# Patient Record
Sex: Female | Born: 1975 | Race: White | Hispanic: No | Marital: Married | Smoking: Never smoker
Health system: Southern US, Community
[De-identification: ages and names within clinical notes are randomized; demographics above are authoritative.]

## PROBLEM LIST (undated history)

## (undated) DIAGNOSIS — J982 Interstitial emphysema: Secondary | ICD-10-CM

## (undated) DIAGNOSIS — J1282 Pneumonia due to coronavirus disease 2019: Secondary | ICD-10-CM

## (undated) DIAGNOSIS — J9621 Acute and chronic respiratory failure with hypoxia: Secondary | ICD-10-CM

## (undated) DIAGNOSIS — U071 COVID-19: Secondary | ICD-10-CM

---

## 2013-03-23 DIAGNOSIS — K8010 Calculus of gallbladder with chronic cholecystitis without obstruction: Secondary | ICD-10-CM | POA: Insufficient documentation

## 2014-12-03 DIAGNOSIS — F410 Panic disorder [episodic paroxysmal anxiety] without agoraphobia: Secondary | ICD-10-CM | POA: Insufficient documentation

## 2014-12-03 DIAGNOSIS — K5904 Chronic idiopathic constipation: Secondary | ICD-10-CM | POA: Insufficient documentation

## 2014-12-03 DIAGNOSIS — F411 Generalized anxiety disorder: Secondary | ICD-10-CM | POA: Insufficient documentation

## 2015-04-02 DIAGNOSIS — F419 Anxiety disorder, unspecified: Secondary | ICD-10-CM | POA: Insufficient documentation

## 2016-05-04 DIAGNOSIS — M7661 Achilles tendinitis, right leg: Secondary | ICD-10-CM | POA: Insufficient documentation

## 2016-05-04 DIAGNOSIS — M7730 Calcaneal spur, unspecified foot: Secondary | ICD-10-CM | POA: Insufficient documentation

## 2016-12-09 DIAGNOSIS — G43009 Migraine without aura, not intractable, without status migrainosus: Secondary | ICD-10-CM | POA: Insufficient documentation

## 2019-12-14 ENCOUNTER — Inpatient Hospital Stay
Admit: 2019-12-14 | Discharge: 2020-01-04 | Disposition: A | Discharge status: Skilled Nursing Facility | Payer: BC Managed Care – PPO | Source: Other Acute Inpatient Hospital | Attending: Internal Medicine | Admitting: Internal Medicine

## 2019-12-14 DIAGNOSIS — J9621 Acute and chronic respiratory failure with hypoxia: Secondary | ICD-10-CM | POA: Diagnosis present

## 2019-12-14 DIAGNOSIS — J1282 Pneumonia due to coronavirus disease 2019: Secondary | ICD-10-CM | POA: Diagnosis present

## 2019-12-14 DIAGNOSIS — J189 Pneumonia, unspecified organism: Secondary | ICD-10-CM

## 2019-12-14 DIAGNOSIS — U071 COVID-19: Secondary | ICD-10-CM | POA: Diagnosis present

## 2019-12-14 DIAGNOSIS — J982 Interstitial emphysema: Secondary | ICD-10-CM | POA: Diagnosis present

## 2019-12-14 DIAGNOSIS — J9690 Respiratory failure, unspecified, unspecified whether with hypoxia or hypercapnia: Secondary | ICD-10-CM

## 2019-12-14 DIAGNOSIS — Z931 Gastrostomy status: Secondary | ICD-10-CM

## 2019-12-14 HISTORY — DX: COVID-19: U07.1

## 2019-12-14 HISTORY — DX: Interstitial emphysema: J98.2

## 2019-12-14 HISTORY — DX: Acute and chronic respiratory failure with hypoxia: J96.21

## 2019-12-14 HISTORY — DX: Pneumonia due to coronavirus disease 2019: J12.82

## 2019-12-15 ENCOUNTER — Other Ambulatory Visit (HOSPITAL_COMMUNITY): Payer: BC Managed Care – PPO

## 2019-12-15 DIAGNOSIS — U071 COVID-19: Secondary | ICD-10-CM | POA: Diagnosis not present

## 2019-12-15 DIAGNOSIS — J9621 Acute and chronic respiratory failure with hypoxia: Secondary | ICD-10-CM | POA: Diagnosis not present

## 2019-12-15 DIAGNOSIS — J982 Interstitial emphysema: Secondary | ICD-10-CM | POA: Diagnosis not present

## 2019-12-15 DIAGNOSIS — J1282 Pneumonia due to coronavirus disease 2019: Secondary | ICD-10-CM

## 2019-12-15 LAB — URINALYSIS, ROUTINE W REFLEX MICROSCOPIC
Bilirubin Urine: NEGATIVE
Glucose, UA: NEGATIVE mg/dL
Hgb urine dipstick: NEGATIVE
Ketones, ur: NEGATIVE mg/dL
Leukocytes,Ua: NEGATIVE
Nitrite: NEGATIVE
Protein, ur: NEGATIVE mg/dL
Specific Gravity, Urine: 1.018 (ref 1.005–1.030)
pH: 6 (ref 5.0–8.0)

## 2019-12-15 LAB — CBC
HCT: 37.6 % (ref 36.0–46.0)
Hemoglobin: 11.4 g/dL — ABNORMAL LOW (ref 12.0–15.0)
MCH: 25.3 pg — ABNORMAL LOW (ref 26.0–34.0)
MCHC: 30.3 g/dL (ref 30.0–36.0)
MCV: 83.4 fL (ref 80.0–100.0)
Platelets: 362 10*3/uL (ref 150–400)
RBC: 4.51 MIL/uL (ref 3.87–5.11)
RDW: 24.1 % — ABNORMAL HIGH (ref 11.5–15.5)
WBC: 13.1 10*3/uL — ABNORMAL HIGH (ref 4.0–10.5)
nRBC: 0.2 % (ref 0.0–0.2)

## 2019-12-15 LAB — COMPREHENSIVE METABOLIC PANEL
ALT: 77 U/L — ABNORMAL HIGH (ref 0–44)
AST: 38 U/L (ref 15–41)
Albumin: 2.9 g/dL — ABNORMAL LOW (ref 3.5–5.0)
Alkaline Phosphatase: 116 U/L (ref 38–126)
Anion gap: 13 (ref 5–15)
BUN: 16 mg/dL (ref 6–20)
CO2: 24 mmol/L (ref 22–32)
Calcium: 10 mg/dL (ref 8.9–10.3)
Chloride: 101 mmol/L (ref 98–111)
Creatinine, Ser: 0.46 mg/dL (ref 0.44–1.00)
GFR, Estimated: >60 mL/min (ref >60)
Glucose, Bld: 136 mg/dL — ABNORMAL HIGH (ref 70–99)
Potassium: 3.9 mmol/L (ref 3.5–5.1)
Sodium: 138 mmol/L (ref 135–145)
Total Bilirubin: 0.5 mg/dL (ref 0.3–1.2)
Total Protein: 6.6 g/dL (ref 6.5–8.1)

## 2019-12-15 LAB — BLOOD GAS, ARTERIAL
Acid-Base Excess: 3.6 mmol/L — ABNORMAL HIGH (ref 0.0–2.0)
Bicarbonate: 28.1 mmol/L — ABNORMAL HIGH (ref 20.0–28.0)
FIO2: 45
O2 Saturation: 98.6 %
Patient temperature: 36.8
pCO2 arterial: 45.7 mmHg (ref 32.0–48.0)
pH, Arterial: 7.404 (ref 7.350–7.450)
pO2, Arterial: 126 mmHg — ABNORMAL HIGH (ref 83.0–108.0)

## 2019-12-15 LAB — MAGNESIUM: Magnesium: 1.9 mg/dL (ref 1.7–2.4)

## 2019-12-15 MED ORDER — IOHEXOL 300 MG/ML  SOLN
40.0000 mL | Freq: Once | INTRAMUSCULAR | Status: AC | PRN
  Administered 2019-12-15: 40 mL

## 2019-12-15 NOTE — Consult Note (Signed)
Pulmonary Critical Care Medicine [facility] [place]  PULMONARY SERVICE  Date of Service: 12/15/2019  PULMONARY CRITICAL CARE CONSULT   Susan [name]  ZOX:[id number]  DOB: Feb 08, 1976   DOA: 12/14/2019  Referring Physician: Carron Curie, MD  HPI: Susan [name] is a 44 y.o. female seen for follow up of Acute on Chronic Respiratory Failure.  Patient came into the hospital because of increasing shortness of breath.  He was severely hypoxic at the time that the patient was evaluated and subsequently was diagnosed with COVID-19 pneumonia.  Patient was admitted on September 8 and remained in the hospital for prolonged period of time because of failure to come off of the ventilator.  Hospital course was quite complicated apparently developed a pneumomediastinum.  The patient subsequently not able to come off the ventilator and is transferred to our facility for further management and weaning.  Other medical issues include history of anxiety in the past which may also be a limiting factor as far as being able.  Review of Systems:  ROS performed and is unremarkable other than noted above.  Past Medical History:  Diagnosis Date  . Allergy  . Anxiety  . Heart murmur   Past Surgical History:  Procedure Laterality Date  . CHOLECYSTECTOMY  . HYSTERECTOMY  . TONSILLECTOMY  . TUBAL LIGATION   Family History  Problem Relation Age of Onset  . Hypertension Mother  . Cancer Father  . Asthma Son  . Arthritis Maternal Grandmother  . Hyperlipidemia Maternal Grandmother    Medications: Reviewed on Rounds  Physical Exam:  Vitals: Temperature is 97.3 pulse 110 respiratory rate 22 blood pressure is 133/87 saturations 99%  Ventilator Settings on assist control FiO2 is 45% tidal volume 450 PEEP 5  . General: Comfortable at this time . Eyes: Grossly normal lids, irises & conjunctiva . ENT: grossly tongue is normal . Neck: no obvious mass . Cardiovascular: S1-S2 normal no gallop  or rub . Respiratory: Scattered rhonchi expansion is equal . Abdomen: Soft nontender . Skin: no rash seen on limited exam . Musculoskeletal: not rigid . Psychiatric:unable to assess . Neurologic: no seizure no involuntary movements         Labs on Admission:  Basic Metabolic Panel: Recent Labs  Lab 12/15/19 0447  NA 138  K 3.9  CL 101  CO2 24  GLUCOSE 136*  BUN 16  CREATININE 0.46  CALCIUM 10.0  MG 1.9    No results for input(s): PHART, PCO2ART, PO2ART, HCO3, O2SAT in the last 168 hours.  Liver Function Tests: Recent Labs  Lab 12/15/19 0447  AST 38  ALT 77*  ALKPHOS 116  BILITOT 0.5  PROT 6.6  ALBUMIN 2.9*   No results for input(s): LIPASE, AMYLASE in the last 168 hours. No results for input(s): AMMONIA in the last 168 hours.  CBC: Recent Labs  Lab 12/15/19 0447  WBC 13.1*  HGB 11.4*  HCT 37.6  MCV 83.4  PLT 362    Cardiac Enzymes: No results for input(s): CKTOTAL, CKMB, CKMBINDEX, TROPONINI in the last 168 hours.  BNP (last 3 results) No results for input(s): BNP in the last 8760 hours.  ProBNP (last 3 results) No results for input(s): PROBNP in the last 8760 hours.   Radiological Exams on Admission: DG ABDOMEN PEG TUBE LOCATION  Result Date: 12/15/2019 CLINICAL DATA:  Peg tube placement verification. EXAM: ABDOMEN - 1 VIEW COMPARISON:  None. FINDINGS: PO contrast noted to partially opacify the gastric lumen and duodenal lumen. PO contrast  reaches to the fourth portion of the duodenum/region of the ligamentum teres. The bowel gas pattern is normal. Surgical clips within the right upper quadrant. Suggestion of a gastrostomy tube overlying the gastric lumen. No radio-opaque calculi or other significant radiographic abnormality are seen. IMPRESSION: 1. PO contrast partially opacifies the gastric lumen and duodenal lumen suggesting proper positioning of the PEG tube. 2. Nonobstructive bowel gas pattern. Electronically Signed   By: Tish Frederickson M.D.    On: 12/15/2019 01:18   DG CHEST PORT 1 VIEW  Result Date: 12/15/2019 CLINICAL DATA:  Peg tube placement verification, respiratory failure. EXAM: PORTABLE CHEST 1 VIEW COMPARISON:  Chest x-ray 12/13/2019 FINDINGS: Tracheostomy tube with tip terminating approximately 4 cm above the carina. The heart size and mediastinal contours are unchanged. Low lung volumes with bibasilar compressive atelectasis. Diffuse patchy airspace opacities. No pulmonary edema. No pleural effusion. No pneumothorax. No acute osseous abnormality. Partially visualized gastric lumen opacified with PO contrast. IMPRESSION: 1. Low lung volumes with multifocal pneumonia. 2. Please see separately dictated x-ray abdomen 12/15/2019. Electronically Signed   By: Tish Frederickson M.D.   On: 12/15/2019 01:20    Assessment/Plan Active Problems:   Acute on chronic respiratory failure with hypoxia (HCC)   COVID-19 virus infection   Pneumonia due to COVID-19 virus   Pneumomediastinum (HCC)   1. Acute on chronic respiratory failure with hypoxia respiratory therapy will check the RSB I mechanics try to wean protocol started today 2. COVID-19 virus infection in recovery phase continue supportive care. 3. COVID-19 pneumonia has been treated patient does not receive antibiotics.  We will continue to follow radiologically.  Currently chest x-ray still showing some significant infiltrates with low lung volumes. 4. Pneumomediastinum noted at the other facility.  We will continue to monitor no other active intervention necessary right now.  I have personally seen and evaluated the patient, evaluated laboratory and imaging results, formulated the assessment and plan and placed orders. The Patient requires high complexity decision making with multiple systems involvement.  Case was discussed on Rounds with the Respiratory Therapy Director and the Respiratory staff Time Spent  Yevonne Pax, MD [facility] Pulmonary Critical Care  Medicine Sleep Medicine

## 2019-12-16 ENCOUNTER — Other Ambulatory Visit (HOSPITAL_COMMUNITY): Payer: BC Managed Care – PPO

## 2019-12-16 DIAGNOSIS — J1282 Pneumonia due to coronavirus disease 2019: Secondary | ICD-10-CM | POA: Diagnosis not present

## 2019-12-16 DIAGNOSIS — U071 COVID-19: Secondary | ICD-10-CM | POA: Diagnosis not present

## 2019-12-16 DIAGNOSIS — J9621 Acute and chronic respiratory failure with hypoxia: Secondary | ICD-10-CM | POA: Diagnosis not present

## 2019-12-16 DIAGNOSIS — J982 Interstitial emphysema: Secondary | ICD-10-CM | POA: Diagnosis not present

## 2019-12-16 LAB — BASIC METABOLIC PANEL
Anion gap: 11 (ref 5–15)
BUN: 19 mg/dL (ref 6–20)
CO2: 29 mmol/L (ref 22–32)
Calcium: 9.7 mg/dL (ref 8.9–10.3)
Chloride: 98 mmol/L (ref 98–111)
Creatinine, Ser: 0.45 mg/dL (ref 0.44–1.00)
GFR, Estimated: >60 mL/min (ref >60)
Glucose, Bld: 111 mg/dL — ABNORMAL HIGH (ref 70–99)
Potassium: 4.1 mmol/L (ref 3.5–5.1)
Sodium: 138 mmol/L (ref 135–145)

## 2019-12-16 LAB — T4, FREE: Free T4: 1.07 ng/dL (ref 0.61–1.12)

## 2019-12-16 LAB — CBC
HCT: 36.8 % (ref 36.0–46.0)
Hemoglobin: 11.1 g/dL — ABNORMAL LOW (ref 12.0–15.0)
MCH: 24.7 pg — ABNORMAL LOW (ref 26.0–34.0)
MCHC: 30.2 g/dL (ref 30.0–36.0)
MCV: 82 fL (ref 80.0–100.0)
Platelets: 356 10*3/uL (ref 150–400)
RBC: 4.49 MIL/uL (ref 3.87–5.11)
RDW: 23.4 % — ABNORMAL HIGH (ref 11.5–15.5)
WBC: 16.7 10*3/uL — ABNORMAL HIGH (ref 4.0–10.5)
nRBC: 0 % (ref 0.0–0.2)

## 2019-12-16 LAB — PHOSPHORUS: Phosphorus: 4 mg/dL (ref 2.5–4.6)

## 2019-12-16 LAB — URINE CULTURE: Culture: NO GROWTH

## 2019-12-16 LAB — MAGNESIUM: Magnesium: 2.1 mg/dL (ref 1.7–2.4)

## 2019-12-16 LAB — TSH: TSH: 1.583 u[IU]/mL (ref 0.350–4.500)

## 2019-12-16 MED ORDER — IOHEXOL 350 MG/ML SOLN
50.0000 mL | Freq: Once | INTRAVENOUS | Status: AC | PRN
  Administered 2019-12-16: 50 mL

## 2019-12-16 NOTE — Progress Notes (Addendum)
Pulmonary Critical Care Medicine [facility] [place]   PULMONARY CRITICAL CARE SERVICE  PROGRESS NOTE  Date of Service: 12/16/2019  Susan [name]  YWV:[id number]  DOB: 1975/07/21   DOA: 12/14/2019  Referring Physician: Carron Curie, MD  HPI: Susan [name] is a 44 y.o. female seen for follow up of Acute on Chronic Respiratory Failure.  Patient is unable to wean today due to tachycardia remains on full support currently rate of 10 with FiO2 45%.  Medications: Reviewed on Rounds  Physical Exam:  Vitals: Pulse 127 respirations 25 BP 141/78 O2 sat 99% temp 98.7  Ventilator Settings ventilator mode AC VC rate of 10 tidal volume 450 PEEP of 5 and FiO2 of 45%  . General: Comfortable at this time . Eyes: Grossly normal lids, irises & conjunctiva . ENT: grossly tongue is normal . Neck: no obvious mass . Cardiovascular: S1 S2 normal no gallop . Respiratory: Coarse breath sounds . Abdomen: soft . Skin: no rash seen on limited exam . Musculoskeletal: not rigid . Psychiatric:unable to assess . Neurologic: no seizure no involuntary movements         Lab Data:   Basic Metabolic Panel: Recent Labs  Lab 12/15/19 0447 12/16/19 0411  NA 138 138  K 3.9 4.1  CL 101 98  CO2 24 29  GLUCOSE 136* 111*  BUN 16 19  CREATININE 0.46 0.45  CALCIUM 10.0 9.7  MG 1.9 2.1  PHOS  --  4.0    ABG: Recent Labs  Lab 12/15/19 1220  PHART 7.404  PCO2ART 45.7  PO2ART 126*  HCO3 28.1*  O2SAT 98.6    Liver Function Tests: Recent Labs  Lab 12/15/19 0447  AST 38  ALT 77*  ALKPHOS 116  BILITOT 0.5  PROT 6.6  ALBUMIN 2.9*   No results for input(s): LIPASE, AMYLASE in the last 168 hours. No results for input(s): AMMONIA in the last 168 hours.  CBC: Recent Labs  Lab 12/15/19 0447 12/16/19 0411  WBC 13.1* 16.7*  HGB 11.4* 11.1*  HCT 37.6 36.8  MCV 83.4 82.0  PLT 362 356    Cardiac Enzymes: No results for input(s): CKTOTAL, CKMB, CKMBINDEX, TROPONINI in the  last 168 hours.  BNP (last 3 results) No results for input(s): BNP in the last 8760 hours.  ProBNP (last 3 results) No results for input(s): PROBNP in the last 8760 hours.  Radiological Exams: DG ABDOMEN PEG TUBE LOCATION  Result Date: 12/16/2019 CLINICAL DATA:  Peg tube placement evaluation. EXAM: ABDOMEN - 1 VIEW COMPARISON:  December 15, 2019 FINDINGS: The study was performed after injecting the PEG tube with 50 mL of Omnipaque. There is contrast in the stomach and proximal duodenum. No evidence of leak. There is a small amount of contrast in the colon, likely from yesterday's study. No other acute abnormalities. IMPRESSION: The PEG tube appears to be in good position as above. No evidence of leak on this study. Electronically Signed   By: Gerome Sam III M.D   On: 12/16/2019 12:57   DG ABDOMEN PEG TUBE LOCATION  Result Date: 12/15/2019 CLINICAL DATA:  Peg tube placement verification. EXAM: ABDOMEN - 1 VIEW COMPARISON:  None. FINDINGS: PO contrast noted to partially opacify the gastric lumen and duodenal lumen. PO contrast reaches to the fourth portion of the duodenum/region of the ligamentum teres. The bowel gas pattern is normal. Surgical clips within the right upper quadrant. Suggestion of a gastrostomy tube overlying the gastric lumen. No radio-opaque calculi or other significant radiographic  abnormality are seen. IMPRESSION: 1. PO contrast partially opacifies the gastric lumen and duodenal lumen suggesting proper positioning of the PEG tube. 2. Nonobstructive bowel gas pattern. Electronically Signed   By: Tish Frederickson M.D.   On: 12/15/2019 01:18   DG CHEST PORT 1 VIEW  Result Date: 12/15/2019 CLINICAL DATA:  Peg tube placement verification, respiratory failure. EXAM: PORTABLE CHEST 1 VIEW COMPARISON:  Chest x-ray 12/13/2019 FINDINGS: Tracheostomy tube with tip terminating approximately 4 cm above the carina. The heart size and mediastinal contours are unchanged. Low lung volumes  with bibasilar compressive atelectasis. Diffuse patchy airspace opacities. No pulmonary edema. No pleural effusion. No pneumothorax. No acute osseous abnormality. Partially visualized gastric lumen opacified with PO contrast. IMPRESSION: 1. Low lung volumes with multifocal pneumonia. 2. Please see separately dictated x-ray abdomen 12/15/2019. Electronically Signed   By: Tish Frederickson M.D.   On: 12/15/2019 01:20    Assessment/Plan Active Problems:   * No active hospital problems. *   1. Acute on chronic respiratory failure with hypoxia patient is unable to wean today will be continue to monitor for R SBI mechanics.  Continue supportive measures and pulmonary toilet. 2. COVID-19 virus infection in recovery phase continue supportive care. 3. COVID-19 pneumonia has been treated patient does not receive antibiotics.  We will continue to follow radiologically.  Currently chest x-ray still showing some significant infiltrates with low lung volumes. 4. Pneumomediastinum noted at the other facility.  We will continue to monitor no other active intervention necessary right now.   I have personally seen and evaluated the patient, evaluated laboratory and imaging results, formulated the assessment and plan and placed orders. The Patient requires high complexity decision making with multiple systems involvement.  Rounds were done with the Respiratory Therapy Director and Staff therapists and discussed with nursing staff also.  Yevonne Pax, MD [facility] Pulmonary Critical Care Medicine Sleep Medicine

## 2019-12-17 DIAGNOSIS — J1282 Pneumonia due to coronavirus disease 2019: Secondary | ICD-10-CM | POA: Diagnosis not present

## 2019-12-17 DIAGNOSIS — U071 COVID-19: Secondary | ICD-10-CM | POA: Diagnosis not present

## 2019-12-17 DIAGNOSIS — J9621 Acute and chronic respiratory failure with hypoxia: Secondary | ICD-10-CM | POA: Diagnosis not present

## 2019-12-17 DIAGNOSIS — J982 Interstitial emphysema: Secondary | ICD-10-CM | POA: Diagnosis not present

## 2019-12-17 LAB — BASIC METABOLIC PANEL
Anion gap: 11 (ref 5–15)
BUN: 20 mg/dL (ref 6–20)
CO2: 28 mmol/L (ref 22–32)
Calcium: 9.7 mg/dL (ref 8.9–10.3)
Chloride: 98 mmol/L (ref 98–111)
Creatinine, Ser: 0.59 mg/dL (ref 0.44–1.00)
GFR, Estimated: >60 mL/min (ref >60)
Glucose, Bld: 144 mg/dL — ABNORMAL HIGH (ref 70–99)
Potassium: 4.1 mmol/L (ref 3.5–5.1)
Sodium: 137 mmol/L (ref 135–145)

## 2019-12-17 LAB — CBC
HCT: 35 % — ABNORMAL LOW (ref 36.0–46.0)
Hemoglobin: 10.7 g/dL — ABNORMAL LOW (ref 12.0–15.0)
MCH: 25.3 pg — ABNORMAL LOW (ref 26.0–34.0)
MCHC: 30.6 g/dL (ref 30.0–36.0)
MCV: 82.7 fL (ref 80.0–100.0)
Platelets: 316 10*3/uL (ref 150–400)
RBC: 4.23 MIL/uL (ref 3.87–5.11)
RDW: 23.6 % — ABNORMAL HIGH (ref 11.5–15.5)
WBC: 13.1 10*3/uL — ABNORMAL HIGH (ref 4.0–10.5)
nRBC: 0 % (ref 0.0–0.2)

## 2019-12-17 LAB — CULTURE, RESPIRATORY W GRAM STAIN

## 2019-12-17 LAB — MAGNESIUM: Magnesium: 2 mg/dL (ref 1.7–2.4)

## 2019-12-17 LAB — T3: T3, Total: 110 ng/dL (ref 71–180)

## 2019-12-17 LAB — HEMOGLOBIN A1C
Hgb A1c MFr Bld: 5.3 % (ref 4.8–5.6)
Mean Plasma Glucose: 105 mg/dL

## 2019-12-17 LAB — PHOSPHORUS: Phosphorus: 4.7 mg/dL — ABNORMAL HIGH (ref 2.5–4.6)

## 2019-12-17 NOTE — Progress Notes (Signed)
Pulmonary Critical Care Medicine [facility] [place]   PULMONARY CRITICAL CARE SERVICE  PROGRESS NOTE  Date of Service: 12/17/2019  Susan [name]  YFV:[id number]  DOB: 04-17-1975   DOA: 12/14/2019  Referring Physician: Carron Curie, MD  HPI: Susan [name] is a 43 y.o. female seen for follow up of Acute on Chronic Respiratory Failure.  Patient currently is on full support on the ventilator.  Should be started on the wean protocol today and pressure support  Medications: Reviewed on Rounds  Physical Exam:  Vitals: Temperature is 97.4 pulse 130 respiratory 31 blood pressure is 122/82 saturations 100%  Ventilator Settings on assist control FiO2 is 45% tidal volume 450 PEEP 5  . General: Comfortable at this time . Eyes: Grossly normal lids, irises & conjunctiva . ENT: grossly tongue is normal . Neck: no obvious mass . Cardiovascular: S1 S2 normal no gallop . Respiratory: Coarse breath sounds with few scattered rhonchi . Abdomen: soft . Skin: no rash seen on limited exam . Musculoskeletal: not rigid . Psychiatric:unable to assess . Neurologic: no seizure no involuntary movements         Lab Data:   Basic Metabolic Panel: Recent Labs  Lab 12/15/19 0447 12/16/19 0411 12/17/19 0722  NA 138 138 137  K 3.9 4.1 4.1  CL 101 98 98  CO2 24 29 28   GLUCOSE 136* 111* 144*  BUN 16 19 20   CREATININE 0.46 0.45 0.59  CALCIUM 10.0 9.7 9.7  MG 1.9 2.1 2.0  PHOS  --  4.0 4.7*    ABG: Recent Labs  Lab 12/15/19 1220  PHART 7.404  PCO2ART 45.7  PO2ART 126*  HCO3 28.1*  O2SAT 98.6    Liver Function Tests: Recent Labs  Lab 12/15/19 0447  AST 38  ALT 77*  ALKPHOS 116  BILITOT 0.5  PROT 6.6  ALBUMIN 2.9*   No results for input(s): LIPASE, AMYLASE in the last 168 hours. No results for input(s): AMMONIA in the last 168 hours.  CBC: Recent Labs  Lab 12/15/19 0447 12/16/19 0411 12/17/19 0722  WBC 13.1* 16.7* 13.1*  HGB 11.4* 11.1* 10.7*  HCT 37.6  36.8 35.0*  MCV 83.4 82.0 82.7  PLT 362 356 316    Cardiac Enzymes: No results for input(s): CKTOTAL, CKMB, CKMBINDEX, TROPONINI in the last 168 hours.  BNP (last 3 results) No results for input(s): BNP in the last 8760 hours.  ProBNP (last 3 results) No results for input(s): PROBNP in the last 8760 hours.  Radiological Exams: DG ABDOMEN PEG TUBE LOCATION  Result Date: 12/16/2019 CLINICAL DATA:  Peg tube placement evaluation. EXAM: ABDOMEN - 1 VIEW COMPARISON:  December 15, 2019 FINDINGS: The study was performed after injecting the PEG tube with 50 mL of Omnipaque. There is contrast in the stomach and proximal duodenum. No evidence of leak. There is a small amount of contrast in the colon, likely from yesterday's study. No other acute abnormalities. IMPRESSION: The PEG tube appears to be in good position as above. No evidence of leak on this study. Electronically Signed   By: 12/18/2019 III M.D   On: 12/16/2019 12:57    Assessment/Plan Active Problems:   Acute on chronic respiratory failure with hypoxia (HCC)   COVID-19 virus infection   Pneumonia due to COVID-19 virus   Pneumomediastinum (HCC)   1. Acute on chronic respiratory failure hypoxia we'll try to begin with the weaning protocol on pressure support mode today.  Titrate oxygen down as tolerated also 2.  COVID-19 pneumonia in resolution phase we will continue with supportive care. 3. COVID-19 virus infection resolved the patient does have residual damage noted on chest films. 4. Pneumomediastinum  supportive care no active intervention at this time.   I have personally seen and evaluated the patient, evaluated laboratory and imaging results, formulated the assessment and plan and placed orders. The Patient requires high complexity decision making with multiple systems involvement.  Rounds were done with the Respiratory Therapy Director and Staff therapists and discussed with nursing staff also.  Yevonne Pax, MD  [facility] Pulmonary Critical Care Medicine Sleep Medicine

## 2019-12-18 ENCOUNTER — Other Ambulatory Visit (HOSPITAL_COMMUNITY): Payer: BC Managed Care – PPO

## 2019-12-18 ENCOUNTER — Encounter: Payer: Self-pay | Admitting: Internal Medicine

## 2019-12-18 DIAGNOSIS — U071 COVID-19: Secondary | ICD-10-CM | POA: Diagnosis not present

## 2019-12-18 DIAGNOSIS — J9621 Acute and chronic respiratory failure with hypoxia: Secondary | ICD-10-CM | POA: Diagnosis not present

## 2019-12-18 DIAGNOSIS — J982 Interstitial emphysema: Secondary | ICD-10-CM | POA: Diagnosis not present

## 2019-12-18 DIAGNOSIS — J1282 Pneumonia due to coronavirus disease 2019: Secondary | ICD-10-CM | POA: Diagnosis not present

## 2019-12-18 LAB — PROTIME-INR
INR: 1.1 (ref 0.8–1.2)
Prothrombin Time: 13.4 seconds (ref 11.4–15.2)

## 2019-12-18 LAB — RENAL FUNCTION PANEL
Albumin: 2.8 g/dL — ABNORMAL LOW (ref 3.5–5.0)
Anion gap: 10 (ref 5–15)
BUN: 16 mg/dL (ref 6–20)
CO2: 31 mmol/L (ref 22–32)
Calcium: 9.7 mg/dL (ref 8.9–10.3)
Chloride: 98 mmol/L (ref 98–111)
Creatinine, Ser: 0.55 mg/dL (ref 0.44–1.00)
GFR, Estimated: >60 mL/min (ref >60)
Glucose, Bld: 130 mg/dL — ABNORMAL HIGH (ref 70–99)
Phosphorus: 5.5 mg/dL — ABNORMAL HIGH (ref 2.5–4.6)
Potassium: 3.7 mmol/L (ref 3.5–5.1)
Sodium: 139 mmol/L (ref 135–145)

## 2019-12-18 LAB — CBC
HCT: 36.4 % (ref 36.0–46.0)
Hemoglobin: 11 g/dL — ABNORMAL LOW (ref 12.0–15.0)
MCH: 25.1 pg — ABNORMAL LOW (ref 26.0–34.0)
MCHC: 30.2 g/dL (ref 30.0–36.0)
MCV: 83.1 fL (ref 80.0–100.0)
Platelets: 316 10*3/uL (ref 150–400)
RBC: 4.38 MIL/uL (ref 3.87–5.11)
RDW: 23.3 % — ABNORMAL HIGH (ref 11.5–15.5)
WBC: 11.2 10*3/uL — ABNORMAL HIGH (ref 4.0–10.5)
nRBC: 0 % (ref 0.0–0.2)

## 2019-12-18 LAB — APTT: aPTT: 40 seconds — ABNORMAL HIGH (ref 24–36)

## 2019-12-18 MED ORDER — IOPAMIDOL (ISOVUE-300) INJECTION 61%: 20.0000 mL | Freq: Once | INTRAVENOUS | Status: DC | PRN

## 2019-12-18 NOTE — Progress Notes (Addendum)
Pulmonary Critical Care Medicine [facility] [place]   PULMONARY CRITICAL CARE SERVICE  PROGRESS NOTE  Date of Service: 12/18/2019  Susan [name]  [id number]  DOB: 1975-03-12   DOA: 12/14/2019  Referring Physician: Carron Curie, MD  HPI: Susan [name] is a 44 y.o. female seen for follow up of Acute on Chronic Respiratory Failure.  Patient continues on full support on the vent assist-control mode rate 24 with an FiO2 of 45% currently satting well this time no distress.  Medications: Reviewed on Rounds  Physical Exam:  Vitals: Pulse 120 respirations 35 BP 132/86 O2 sat 99% temp 97.4  Ventilator Settings ventilator mode AC VC rate of 24 tidal volume 520 PEEP of 8 and FiO2 of 45%  . General: Comfortable at this time . Eyes: Grossly normal lids, irises & conjunctiva . ENT: grossly tongue is normal . Neck: no obvious mass . Cardiovascular: S1 S2 normal no gallop . Respiratory: No rales or rhonchi noted . Abdomen: soft . Skin: no rash seen on limited exam . Musculoskeletal: not rigid . Psychiatric:unable to assess . Neurologic: no seizure no involuntary movements         Lab Data:   Basic Metabolic Panel: Recent Labs  Lab 12/15/19 0447 12/16/19 0411 12/17/19 0722 12/18/19 0514  NA 138 138 137 139  K 3.9 4.1 4.1 3.7  CL 101 98 98 98  CO2 24 29 28 31   GLUCOSE 136* 111* 144* 130*  BUN 16 19 20 16   CREATININE 0.46 0.45 0.59 0.55  CALCIUM 10.0 9.7 9.7 9.7  MG 1.9 2.1 2.0  --   PHOS  --  4.0 4.7* 5.5*    ABG: Recent Labs  Lab 12/15/19 1220  PHART 7.404  PCO2ART 45.7  PO2ART 126*  HCO3 28.1*  O2SAT 98.6    Liver Function Tests: Recent Labs  Lab 12/15/19 0447 12/18/19 0514  AST 38  --   ALT 77*  --   ALKPHOS 116  --   BILITOT 0.5  --   PROT 6.6  --   ALBUMIN 2.9* 2.8*   No results for input(s): LIPASE, AMYLASE in the last 168 hours. No results for input(s): AMMONIA in the last 168 hours.  CBC: Recent Labs  Lab 12/15/19 0447  12/16/19 0411 12/17/19 0722 12/18/19 0514  WBC 13.1* 16.7* 13.1* 11.2*  HGB 11.4* 11.1* 10.7* 11.0*  HCT 37.6 36.8 35.0* 36.4  MCV 83.4 82.0 82.7 83.1  PLT 362 356 316 316    Cardiac Enzymes: No results for input(s): CKTOTAL, CKMB, CKMBINDEX, TROPONINI in the last 168 hours.  BNP (last 3 results) No results for input(s): BNP in the last 8760 hours.  ProBNP (last 3 results) No results for input(s): PROBNP in the last 8760 hours.  Radiological Exams: DG ABDOMEN PEG TUBE LOCATION  Result Date: 12/16/2019 CLINICAL DATA:  Peg tube placement evaluation. EXAM: ABDOMEN - 1 VIEW COMPARISON:  December 15, 2019 FINDINGS: The study was performed after injecting the PEG tube with 50 mL of Omnipaque. There is contrast in the stomach and proximal duodenum. No evidence of leak. There is a small amount of contrast in the colon, likely from yesterday's study. No other acute abnormalities. IMPRESSION: The PEG tube appears to be in good position as above. No evidence of leak on this study. Electronically Signed   By: 12/18/2019 III M.D   On: 12/16/2019 12:57    Assessment/Plan Active Problems:   * No active hospital problems. *   1. Acute  on chronic respiratory failure hypoxia patient remains on full support at this time we will continue to attempt weaning protocol as tolerated on pressure support.  Satting well this time continue supportive measures. 2. COVID-19 pneumonia in resolution phase we will continue with supportive care. 3. COVID-19 virus infection resolved the patient does have residual damage noted on chest films. 4. Pneumomediastinum  supportive care no active intervention at this time.   I have personally seen and evaluated the patient, evaluated laboratory and imaging results, formulated the assessment and plan and placed orders. The Patient requires high complexity decision making with multiple systems involvement.  Rounds were done with the Respiratory Therapy Director and  Staff therapists and discussed with nursing staff also.  Yevonne Pax, MD [facility] Pulmonary Critical Care Medicine Sleep Medicine

## 2019-12-19 DIAGNOSIS — J982 Interstitial emphysema: Secondary | ICD-10-CM | POA: Diagnosis not present

## 2019-12-19 DIAGNOSIS — J9621 Acute and chronic respiratory failure with hypoxia: Secondary | ICD-10-CM | POA: Diagnosis not present

## 2019-12-19 DIAGNOSIS — U071 COVID-19: Secondary | ICD-10-CM | POA: Diagnosis not present

## 2019-12-19 DIAGNOSIS — J1282 Pneumonia due to coronavirus disease 2019: Secondary | ICD-10-CM | POA: Diagnosis not present

## 2019-12-19 NOTE — Consult Note (Signed)
Referring Physician:  Addasyn Mcbreen is an 44 y.o. female.                       Chief Complaint: Tachycardia/SVT  HPI: 44 years old white female with PMH of acute on chronic respiratory failure post COVID 19 pneumonia. She had pneumomediastinum also.  Past Medical History:  Diagnosis Date  . Acute on chronic respiratory failure with hypoxia (HCC)   . COVID-19 virus infection   . Pneumomediastinum (HCC)   . Pneumonia due to COVID-19 virus     PSH: Cholecystectomy Hysterectomy Tonsillectomy Tubal ligation  The histories are not reviewed yet. Please review them in the "History" navigator section and refresh this SmartLink.  Family history: HTN to mother, Cancer to father, Asthma to son, Arthritis and hyperlipidemia to maternal Grandmother.  Social History:  has no history on file for tobacco use, alcohol use, and drug use.  Allergies: Not on File  No medications prior to admission.  See medication list on chart  Results for orders placed or performed during the hospital encounter of 12/14/19 (from the past 48 hour(s))  Renal function panel     Status: Abnormal   Collection Time: 12/18/19  5:14 AM  Result Value Ref Range   Sodium 139 135 - 145 mmol/L   Potassium 3.7 3.5 - 5.1 mmol/L   Chloride 98 98 - 111 mmol/L   CO2 31 22 - 32 mmol/L   Glucose, Bld 130 (H) 70 - 99 mg/dL    Comment: Glucose reference range applies only to samples taken after fasting for at least 8 hours.   BUN 16 6 - 20 mg/dL   Creatinine, Ser 3.47 0.44 - 1.00 mg/dL   Calcium 9.7 8.9 - 42.5 mg/dL   Phosphorus 5.5 (H) 2.5 - 4.6 mg/dL   Albumin 2.8 (L) 3.5 - 5.0 g/dL   GFR, Estimated >95 >63 mL/min   Anion gap 10 5 - 15    Comment: Performed at [facility], [address] [place], [place] [address]  CBC     Status: Abnormal   Collection Time: 12/18/19  5:14 AM  Result Value Ref Range   WBC 11.2 (H) 4.0 - 10.5 K/uL   RBC 4.38 3.87 - 5.11 MIL/uL   Hemoglobin 11.0 (L) 12.0 - 15.0 g/dL   HCT  33.2 36 - 46 %   MCV 83.1 80.0 - 100.0 fL   MCH 25.1 (L) 26.0 - 34.0 pg   MCHC 30.2 30.0 - 36.0 g/dL   RDW 95.1 (H) 88.4 - 16.6 %   Platelets 316 150 - 400 K/uL   nRBC 0.0 0.0 - 0.2 %    Comment: Performed at [facility], [address] [place], [place] [address]  Protime-INR     Status: None   Collection Time: 12/18/19  5:14 AM  Result Value Ref Range   Prothrombin Time 13.4 11.4 - 15.2 seconds   INR 1.1 0.8 - 1.2    Comment: (NOTE) INR goal varies based on device and disease states. Performed at [facility], [address] [place], [place] [address]   APTT     Status: Abnormal   Collection Time: 12/18/19  5:14 AM  Result Value Ref Range   aPTT 40 (H) 24 - 36 seconds    Comment:        IF BASELINE aPTT IS ELEVATED, SUGGEST PATIENT RISK ASSESSMENT BE USED TO DETERMINE APPROPRIATE ANTICOAGULANT THERAPY. Performed at  [facility], [address] [place] [place], [place] [address]    DG ABDOMEN PEG TUBE LOCATION  Result Date: 12/18/2019 CLINICAL DATA:  Peg tube status. EXAM: ABDOMEN - 1 VIEW COMPARISON:  December 16, 2019. FINDINGS: The bowel gas pattern is normal. Gastrostomy tube appears to be in grossly good position. Contrast is noted filling the gastric lumen. No definite extravasation or leakage is noted. Status post cholecystectomy. IMPRESSION: Gastrostomy tube is in grossly good position. No definite extravasation or leakage is noted. Electronically Signed   By: Lupita Raider M.D.   On: 12/18/2019 15:54    Review Of Systems Constitutional: Positive fever, chills, weight loss. Eyes: No vision change, wears glasses. No discharge or pain. Ears: No hearing loss, No tinnitus. Respiratory: No asthma, COPD, positive pneumonia, shortness of breath. No hemoptysis. Cardiovascular: Positive chest pain, palpitation, leg edema. Gastrointestinal: No nausea, vomiting, diarrhea, constipation. No GI bleed. No hepatitis. Genitourinary: No dysuria, hematuria, kidney  stone. No incontinance. Neurological: No headache, stroke, seizures.  Psychiatry: No psych facility admission for anxiety, depression, suicide. No detox. Skin: No rash. Musculoskeletal: No joint pain, fibromyalgia. No neck pain, back pain. Lymphadenopathy: No lymphadenopathy. Hematology: Positive anemia. No easy bruising.  P: 130, R: 24, BP: 140/80, O2 sat -100 %. There were no vitals taken for this visit. There is no height or weight on file to calculate BMI. General appearance: alert, cooperative, appears stated age and moderate respiratory distress. Head: Normocephalic, atraumatic. Eyes: Blue eyes, pink conjunctiva, corneas clear. PERRL, EOM's intact. Neck: No adenopathy, no carotid bruit, no JVD, supple, symmetrical, trachea midline and thyroid not enlarged. Resp: Clearing to auscultation bilaterally. Cardio: Regular rate and rhythm, S1, S2 normal, II/VI systolic murmur, no click, rub or gallop GI: Soft, non-tender; bowel sounds normal; no organomegaly. Extremities: Trace edema, no cyanosis or clubbing. Skin: Warm and dry.  Neurologic: Alert and oriented X 2, moves all 4 extremities.  Assessment/Plan Sinus tachycardia Paroxysmal SVT Acute on chronic respiratory failure withhypoxia S/P COVID pneumonia Anxiety  Continue Beta-blocker. Start diltiazem for HR control.  Time spent: Review of old records, Lab, x-rays, EKG, other cardiac tests, examination, discussion with patient, family, PA and nurse over 70 minutes.  Ricki Rodriguez, MD  12/19/2019, 8:12 PM

## 2019-12-19 NOTE — Progress Notes (Addendum)
Pulmonary Critical Care Medicine [facility] [place]   PULMONARY CRITICAL CARE SERVICE  PROGRESS NOTE  Date of Service: 12/19/2019  Susan [name]  ZWC:[id number]  DOB: 05-23-1975   DOA: 12/14/2019  Referring Physician: Carron Curie, MD  HPI: Susan [name] is a 44 y.o. female seen for follow up of Acute on Chronic Respiratory Failure.  Patient continues to fail pressure support remains on full support assist-control mode rate of 10 with an FiO2 45% satting well this time.  Medications: Reviewed on Rounds  Physical Exam:  Vitals: Pulse 110 respirations 28 BP 136/95 O2 sat 100% temp 96.5  Ventilator Settings ventilator mode AC VC rate of 10 tidal volume 450 PEEP of 5 and FiO2 40%  . General: Comfortable at this time . Eyes: Grossly normal lids, irises & conjunctiva . ENT: grossly tongue is normal . Neck: no obvious mass . Cardiovascular: S1 S2 normal no gallop . Respiratory: Coarse breath sounds . Abdomen: soft . Skin: no rash seen on limited exam . Musculoskeletal: not rigid . Psychiatric:unable to assess . Neurologic: no seizure no involuntary movements         Lab Data:   Basic Metabolic Panel: Recent Labs  Lab 12/15/19 0447 12/16/19 0411 12/17/19 0722 12/18/19 0514  NA 138 138 137 139  K 3.9 4.1 4.1 3.7  CL 101 98 98 98  CO2 24 29 28 31   GLUCOSE 136* 111* 144* 130*  BUN 16 19 20 16   CREATININE 0.46 0.45 0.59 0.55  CALCIUM 10.0 9.7 9.7 9.7  MG 1.9 2.1 2.0  --   PHOS  --  4.0 4.7* 5.5*    ABG: Recent Labs  Lab 12/15/19 1220  PHART 7.404  PCO2ART 45.7  PO2ART 126*  HCO3 28.1*  O2SAT 98.6    Liver Function Tests: Recent Labs  Lab 12/15/19 0447 12/18/19 0514  AST 38  --   ALT 77*  --   ALKPHOS 116  --   BILITOT 0.5  --   PROT 6.6  --   ALBUMIN 2.9* 2.8*   No results for input(s): LIPASE, AMYLASE in the last 168 hours. No results for input(s): AMMONIA in the last 168 hours.  CBC: Recent Labs  Lab 12/15/19 0447  12/16/19 0411 12/17/19 0722 12/18/19 0514  WBC 13.1* 16.7* 13.1* 11.2*  HGB 11.4* 11.1* 10.7* 11.0*  HCT 37.6 36.8 35.0* 36.4  MCV 83.4 82.0 82.7 83.1  PLT 362 356 316 316    Cardiac Enzymes: No results for input(s): CKTOTAL, CKMB, CKMBINDEX, TROPONINI in the last 168 hours.  BNP (last 3 results) No results for input(s): BNP in the last 8760 hours.  ProBNP (last 3 results) No results for input(s): PROBNP in the last 8760 hours.  Radiological Exams: DG ABDOMEN PEG TUBE LOCATION  Result Date: 12/18/2019 CLINICAL DATA:  Peg tube status. EXAM: ABDOMEN - 1 VIEW COMPARISON:  December 16, 2019. FINDINGS: The bowel gas pattern is normal. Gastrostomy tube appears to be in grossly good position. Contrast is noted filling the gastric lumen. No definite extravasation or leakage is noted. Status post cholecystectomy. IMPRESSION: Gastrostomy tube is in grossly good position. No definite extravasation or leakage is noted. Electronically Signed   By: 12/20/2019 M.D.   On: 12/18/2019 15:54    Assessment/Plan Active Problems:   Acute on chronic respiratory failure with hypoxia (HCC)   COVID-19 virus infection   Pneumonia due to COVID-19 virus   Pneumomediastinum (HCC)   1. Acute on chronic respiratory failure  hypoxia patient continues to fail weaning at this time to pressure support.  We will continue on full support settings above.  Continue aggressive pulmonary toilet supportive measures. 2. COVID-19 pneumonia in resolution phase we will continue with supportive care. 3. COVID-19 virus infection resolved the patient does have residual damage noted on chest films. 4. Pneumomediastinum supportive care no active intervention at this time.    I have personally seen and evaluated the patient, evaluated laboratory and imaging results, formulated the assessment and plan and placed orders. The Patient requires high complexity decision making with multiple systems involvement.  Rounds were  done with the Respiratory Therapy Director and Staff therapists and discussed with nursing staff also.  Yevonne Pax, MD [facility] Pulmonary Critical Care Medicine Sleep Medicine

## 2019-12-19 NOTE — Consult Note (Signed)
Ref: Calvert Cantor, NP   Subjective:  Awake. Afebrile. Ventilator dependent. HR down to 110's. Normal TSH and Hgb A1C.  Objective:  Vital Signs in the last 24 hours: BP: 130/80  P: 112 O2 sat : 100 %.   Physical Exam: BP Readings from Last 1 Encounters:  No data found for BP     Wt Readings from Last 1 Encounters:  No data found for Wt    Weight change:  There is no height or weight on file to calculate BMI. HEENT: [place]/AT, Eyes-Blue, Conjunctiva-Pink, Sclera-Non-icteric Neck: No JVD, No bruit, Trachea midline. Lungs:  Clearing, Bilateral. Cardiac:  Regular rhythm, normal S1 and S2, no S3. II/VI systolic murmur. Abdomen:  Soft, non-tender. BS present. Extremities:  Trace edema present. No cyanosis. No clubbing. CNS: AxOx2, Cranial nerves grossly intact, moves all 4 extremities.  Skin: Warm and dry.   Intake/Output from previous day: No intake/output data recorded.    Lab Results: BMET    Component Value Date/Time   NA 139 12/18/2019 0514   NA 137 12/17/2019 0722   NA 138 12/16/2019 0411   K 3.7 12/18/2019 0514   K 4.1 12/17/2019 0722   K 4.1 12/16/2019 0411   CL 98 12/18/2019 0514   CL 98 12/17/2019 0722   CL 98 12/16/2019 0411   CO2 31 12/18/2019 0514   CO2 28 12/17/2019 0722   CO2 29 12/16/2019 0411   GLUCOSE 130 (H) 12/18/2019 0514   GLUCOSE 144 (H) 12/17/2019 0722   GLUCOSE 111 (H) 12/16/2019 0411   BUN 16 12/18/2019 0514   BUN 20 12/17/2019 0722   BUN 19 12/16/2019 0411   CREATININE 0.55 12/18/2019 0514   CREATININE 0.59 12/17/2019 0722   CREATININE 0.45 12/16/2019 0411   CALCIUM 9.7 12/18/2019 0514   CALCIUM 9.7 12/17/2019 0722   CALCIUM 9.7 12/16/2019 0411   GFRNONAA >60 12/18/2019 0514   GFRNONAA >60 12/17/2019 0722   GFRNONAA >60 12/16/2019 0411   CBC    Component Value Date/Time   WBC 11.2 (H) 12/18/2019 0514   RBC 4.38 12/18/2019 0514   HGB 11.0 (L) 12/18/2019 0514   HCT 36.4 12/18/2019 0514   PLT 316 12/18/2019 0514   MCV  83.1 12/18/2019 0514   MCH 25.1 (L) 12/18/2019 0514   MCHC 30.2 12/18/2019 0514   RDW 23.3 (H) 12/18/2019 0514   HEPATIC Function Panel Recent Labs    12/15/19 0447  PROT 6.6   HEMOGLOBIN A1C No components found for: HGA1C,  MPG CARDIAC ENZYMES No results found for: CKTOTAL, CKMB, CKMBINDEX, TROPONINI BNP No results for input(s): PROBNP in the last 8760 hours. TSH Recent Labs    12/16/19 0411  TSH 1.583   CHOLESTEROL No results for input(s): CHOL in the last 8760 hours.  Scheduled Meds: Continuous Infusions: PRN Meds:.iopamidol  Assessment/Plan: Sinus tachycardia Paroxysmal SVT Acute on chronic respiratory failure with hypoxia S/P COVID pneumonia Anxiety  Increase diltiazem to 60 mg. q 8 hour for sinus tachycardia.   LOS: 0 days   Time spent including chart review, lab review, examination, discussion with patient and nurse/PA : 30 min   Orpah Cobb  MD  12/19/2019, 8:25 PM

## 2019-12-20 DIAGNOSIS — J982 Interstitial emphysema: Secondary | ICD-10-CM | POA: Diagnosis not present

## 2019-12-20 DIAGNOSIS — J1282 Pneumonia due to coronavirus disease 2019: Secondary | ICD-10-CM | POA: Diagnosis not present

## 2019-12-20 DIAGNOSIS — J9621 Acute and chronic respiratory failure with hypoxia: Secondary | ICD-10-CM | POA: Diagnosis not present

## 2019-12-20 DIAGNOSIS — U071 COVID-19: Secondary | ICD-10-CM | POA: Diagnosis not present

## 2019-12-20 NOTE — Progress Notes (Signed)
Pulmonary Critical Care Medicine [facility] [place]   PULMONARY CRITICAL CARE SERVICE  PROGRESS NOTE  Date of Service: 12/20/2019  Susan [name]  PJK:[id number]  DOB: 1976-01-19   DOA: 12/14/2019  Referring Physician: Carron Curie, MD  HPI: Susan [name] is a 44 y.o. female seen for follow up of Acute on Chronic Respiratory Failure.  Patient currently is on pressure support has been on 40% FiO2 with a goal of up to 12 hours  Medications: Reviewed on Rounds  Physical Exam:  Vitals: Temperature is 96.4 pulse 113 respiratory rate 30 blood pressure is 137/92 saturations 99%  Ventilator Settings on pressure support FiO2 is 40% pressure 12/5  . General: Comfortable at this time . Eyes: Grossly normal lids, irises & conjunctiva . ENT: grossly tongue is normal . Neck: no obvious mass . Cardiovascular: S1 S2 normal no gallop . Respiratory: No rhonchi very coarse breath sounds . Abdomen: soft . Skin: no rash seen on limited exam . Musculoskeletal: not rigid . Psychiatric:unable to assess . Neurologic: no seizure no involuntary movements         Lab Data:   Basic Metabolic Panel: Recent Labs  Lab 12/15/19 0447 12/16/19 0411 12/17/19 0722 12/18/19 0514  NA 138 138 137 139  K 3.9 4.1 4.1 3.7  CL 101 98 98 98  CO2 24 29 28 31   GLUCOSE 136* 111* 144* 130*  BUN 16 19 20 16   CREATININE 0.46 0.45 0.59 0.55  CALCIUM 10.0 9.7 9.7 9.7  MG 1.9 2.1 2.0  --   PHOS  --  4.0 4.7* 5.5*    ABG: Recent Labs  Lab 12/15/19 1220  PHART 7.404  PCO2ART 45.7  PO2ART 126*  HCO3 28.1*  O2SAT 98.6    Liver Function Tests: Recent Labs  Lab 12/15/19 0447 12/18/19 0514  AST 38  --   ALT 77*  --   ALKPHOS 116  --   BILITOT 0.5  --   PROT 6.6  --   ALBUMIN 2.9* 2.8*   No results for input(s): LIPASE, AMYLASE in the last 168 hours. No results for input(s): AMMONIA in the last 168 hours.  CBC: Recent Labs  Lab 12/15/19 0447 12/16/19 0411 12/17/19 0722  12/18/19 0514  WBC 13.1* 16.7* 13.1* 11.2*  HGB 11.4* 11.1* 10.7* 11.0*  HCT 37.6 36.8 35.0* 36.4  MCV 83.4 82.0 82.7 83.1  PLT 362 356 316 316    Cardiac Enzymes: No results for input(s): CKTOTAL, CKMB, CKMBINDEX, TROPONINI in the last 168 hours.  BNP (last 3 results) No results for input(s): BNP in the last 8760 hours.  ProBNP (last 3 results) No results for input(s): PROBNP in the last 8760 hours.  Radiological Exams: DG ABDOMEN PEG TUBE LOCATION  Result Date: 12/18/2019 CLINICAL DATA:  Peg tube status. EXAM: ABDOMEN - 1 VIEW COMPARISON:  December 16, 2019. FINDINGS: The bowel gas pattern is normal. Gastrostomy tube appears to be in grossly good position. Contrast is noted filling the gastric lumen. No definite extravasation or leakage is noted. Status post cholecystectomy. IMPRESSION: Gastrostomy tube is in grossly good position. No definite extravasation or leakage is noted. Electronically Signed   By: 12/20/2019 M.D.   On: 12/18/2019 15:54    Assessment/Plan Active Problems:   Acute on chronic respiratory failure with hypoxia (HCC)   COVID-19 virus infection   Pneumonia due to COVID-19 virus   Pneumomediastinum (HCC)   1. Acute on chronic respiratory failure hypoxia we will continue with on  pressure support titrate as tolerated goal is 12 hours today 2. COVID-19 virus infection in recovery phase 3. Pneumonia due to COVID-19 treated slow improvement 4. Pneumomediastinum supportive care chest x-ray looks good   I have personally seen and evaluated the patient, evaluated laboratory and imaging results, formulated the assessment and plan and placed orders. The Patient requires high complexity decision making with multiple systems involvement.  Rounds were done with the Respiratory Therapy Director and Staff therapists and discussed with nursing staff also.  Yevonne Pax, MD [facility] Pulmonary Critical Care Medicine Sleep Medicine

## 2019-12-21 DIAGNOSIS — U071 COVID-19: Secondary | ICD-10-CM | POA: Diagnosis not present

## 2019-12-21 DIAGNOSIS — J9621 Acute and chronic respiratory failure with hypoxia: Secondary | ICD-10-CM | POA: Diagnosis not present

## 2019-12-21 DIAGNOSIS — J1282 Pneumonia due to coronavirus disease 2019: Secondary | ICD-10-CM | POA: Diagnosis not present

## 2019-12-21 DIAGNOSIS — J982 Interstitial emphysema: Secondary | ICD-10-CM | POA: Diagnosis not present

## 2019-12-21 NOTE — Consult Note (Signed)
Ref: Calvert Cantor, NP   Subjective:  Awake. HR down to 100's.   Objective:  Vital Signs in the last 24 hours: BP: 134/81  P: 101 R: 31 O2 sat: 98%  Physical Exam: BP Readings from Last 1 Encounters:  No data found for BP     Wt Readings from Last 1 Encounters:  No data found for Wt    Weight change:  There is no height or weight on file to calculate BMI. HEENT: [place]/AT, Eyes-Blue, PERL, EOMI, Conjunctiva-Pink, Sclera-Non-icteric Neck: No JVD, No bruit, Trachea midline. Lungs:  Clearing, Bilateral. Cardiac:  Regular rhythm, normal S1 and S2, no S3. II/VI systolic murmur. Abdomen:  Soft, non-tender. BS present. Extremities:  Trace edema present. No cyanosis. No clubbing. CNS: AxOx2, Cranial nerves grossly intact, moves all 4 extremities.  Skin: Warm and dry.   Intake/Output from previous day: No intake/output data recorded.    Lab Results: BMET    Component Value Date/Time   NA 139 12/18/2019 0514   NA 137 12/17/2019 0722   NA 138 12/16/2019 0411   K 3.7 12/18/2019 0514   K 4.1 12/17/2019 0722   K 4.1 12/16/2019 0411   CL 98 12/18/2019 0514   CL 98 12/17/2019 0722   CL 98 12/16/2019 0411   CO2 31 12/18/2019 0514   CO2 28 12/17/2019 0722   CO2 29 12/16/2019 0411   GLUCOSE 130 (H) 12/18/2019 0514   GLUCOSE 144 (H) 12/17/2019 0722   GLUCOSE 111 (H) 12/16/2019 0411   BUN 16 12/18/2019 0514   BUN 20 12/17/2019 0722   BUN 19 12/16/2019 0411   CREATININE 0.55 12/18/2019 0514   CREATININE 0.59 12/17/2019 0722   CREATININE 0.45 12/16/2019 0411   CALCIUM 9.7 12/18/2019 0514   CALCIUM 9.7 12/17/2019 0722   CALCIUM 9.7 12/16/2019 0411   GFRNONAA >60 12/18/2019 0514   GFRNONAA >60 12/17/2019 0722   GFRNONAA >60 12/16/2019 0411   CBC    Component Value Date/Time   WBC 11.2 (H) 12/18/2019 0514   RBC 4.38 12/18/2019 0514   HGB 11.0 (L) 12/18/2019 0514   HCT 36.4 12/18/2019 0514   PLT 316 12/18/2019 0514   MCV 83.1 12/18/2019 0514   MCH 25.1 (L) 12/18/2019  0514   MCHC 30.2 12/18/2019 0514   RDW 23.3 (H) 12/18/2019 0514   HEPATIC Function Panel Recent Labs    12/15/19 0447  PROT 6.6   HEMOGLOBIN A1C No components found for: HGA1C,  MPG CARDIAC ENZYMES No results found for: CKTOTAL, CKMB, CKMBINDEX, TROPONINI BNP No results for input(s): PROBNP in the last 8760 hours. TSH Recent Labs    12/16/19 0411  TSH 1.583   CHOLESTEROL No results for input(s): CHOL in the last 8760 hours.  Scheduled Meds: Continuous Infusions: PRN Meds:.iopamidol  Assessment/Plan: Sinus tachycardia Paroxysmal SVT Acute on chronic respiratory failure S/P COVID pneumonia Anxiety  Continue low dose B-blocker, Calcium channel blocker as needed.   LOS: 0 days   Time spent including chart review, lab review, examination, discussion with patient :  min   Orpah Cobb  MD  12/21/2019, 8:35 PM

## 2019-12-21 NOTE — Progress Notes (Addendum)
Pulmonary Critical Care Medicine [facility] [place]   PULMONARY CRITICAL CARE SERVICE  PROGRESS NOTE  Date of Service: 12/21/2019  Khamia Stambaugh  BDZ:[id number]  DOB: 07/11/1975   DOA: 12/14/2019  Referring Physician: Carron Curie, MD  HPI: Latessa Tillis is a 44 y.o. female seen for follow up of Acute on Chronic Respiratory Failure.  Patient remains on full support assist-control mode rate 10 with FiO2 45% satting well currently no distress.  Medications: Reviewed on Rounds  Physical Exam:  Vitals: Pulse 116 respirations 19 BP 135/94 O2 sat 99% temp 96.1  Ventilator Settings ventilator mode AC VC rate of 10 tidal volume 450 PEEP of 5 and FiO2 of 45%  . General: Comfortable at this time . Eyes: Grossly normal lids, irises & conjunctiva . ENT: grossly tongue is normal . Neck: no obvious mass . Cardiovascular: S1 S2 normal no gallop . Respiratory: No rales or rhonchi noted . Abdomen: soft . Skin: no rash seen on limited exam . Musculoskeletal: not rigid . Psychiatric:unable to assess . Neurologic: no seizure no involuntary movements         Lab Data:   Basic Metabolic Panel: Recent Labs  Lab 12/15/19 0447 12/16/19 0411 12/17/19 0722 12/18/19 0514  NA 138 138 137 139  K 3.9 4.1 4.1 3.7  CL 101 98 98 98  CO2 24 29 28 31   GLUCOSE 136* 111* 144* 130*  BUN 16 19 20 16   CREATININE 0.46 0.45 0.59 0.55  CALCIUM 10.0 9.7 9.7 9.7  MG 1.9 2.1 2.0  --   PHOS  --  4.0 4.7* 5.5*    ABG: Recent Labs  Lab 12/15/19 1220  PHART 7.404  PCO2ART 45.7  PO2ART 126*  HCO3 28.1*  O2SAT 98.6    Liver Function Tests: Recent Labs  Lab 12/15/19 0447 12/18/19 0514  AST 38  --   ALT 77*  --   ALKPHOS 116  --   BILITOT 0.5  --   PROT 6.6  --   ALBUMIN 2.9* 2.8*   No results for input(s): LIPASE, AMYLASE in the last 168 hours. No results for input(s): AMMONIA in the last 168 hours.  CBC: Recent Labs  Lab 12/15/19 0447 12/16/19 0411 12/17/19 0722  12/18/19 0514  WBC 13.1* 16.7* 13.1* 11.2*  HGB 11.4* 11.1* 10.7* 11.0*  HCT 37.6 36.8 35.0* 36.4  MCV 83.4 82.0 82.7 83.1  PLT 362 356 316 316    Cardiac Enzymes: No results for input(s): CKTOTAL, CKMB, CKMBINDEX, TROPONINI in the last 168 hours.  BNP (last 3 results) No results for input(s): BNP in the last 8760 hours.  ProBNP (last 3 results) No results for input(s): PROBNP in the last 8760 hours.  Radiological Exams: No results found.  Assessment/Plan Active Problems:   Acute on chronic respiratory failure with hypoxia (HCC)   COVID-19 virus infection   Pneumonia due to COVID-19 virus   Pneumomediastinum (HCC)   1. Acute on chronic respiratory failure hypoxia patient continue full support at this time settings above.  Continue supportive measures and aggressive pulmonary toilet. 2. COVID-19 virus infection in recovery phase 3. Pneumonia due to COVID-19 treated slow improvement 4. Pneumomediastinum supportive care chest x-ray looks good   I have personally seen and evaluated the patient, evaluated laboratory and imaging results, formulated the assessment and plan and placed orders. The Patient requires high complexity decision making with multiple systems involvement.  Rounds were done with the Respiratory Therapy Director and Staff therapists and discussed with  nursing staff also.  Allyne Gee, MD [facility] Pulmonary Critical Care Medicine Sleep Medicine

## 2019-12-22 DIAGNOSIS — J1282 Pneumonia due to coronavirus disease 2019: Secondary | ICD-10-CM | POA: Diagnosis not present

## 2019-12-22 DIAGNOSIS — J982 Interstitial emphysema: Secondary | ICD-10-CM | POA: Diagnosis not present

## 2019-12-22 DIAGNOSIS — U071 COVID-19: Secondary | ICD-10-CM | POA: Diagnosis not present

## 2019-12-22 DIAGNOSIS — J9621 Acute and chronic respiratory failure with hypoxia: Secondary | ICD-10-CM | POA: Diagnosis not present

## 2019-12-22 LAB — BASIC METABOLIC PANEL
Anion gap: 10 (ref 5–15)
BUN: 13 mg/dL (ref 6–20)
CO2: 29 mmol/L (ref 22–32)
Calcium: 9.8 mg/dL (ref 8.9–10.3)
Chloride: 99 mmol/L (ref 98–111)
Creatinine, Ser: 0.62 mg/dL (ref 0.44–1.00)
GFR, Estimated: >60 mL/min (ref >60)
Glucose, Bld: 127 mg/dL — ABNORMAL HIGH (ref 70–99)
Potassium: 3.8 mmol/L (ref 3.5–5.1)
Sodium: 138 mmol/L (ref 135–145)

## 2019-12-22 LAB — CBC
HCT: 37.3 % (ref 36.0–46.0)
Hemoglobin: 11.7 g/dL — ABNORMAL LOW (ref 12.0–15.0)
MCH: 26.4 pg (ref 26.0–34.0)
MCHC: 31.4 g/dL (ref 30.0–36.0)
MCV: 84 fL (ref 80.0–100.0)
Platelets: 332 10*3/uL (ref 150–400)
RBC: 4.44 MIL/uL (ref 3.87–5.11)
RDW: 23 % — ABNORMAL HIGH (ref 11.5–15.5)
WBC: 11.3 10*3/uL — ABNORMAL HIGH (ref 4.0–10.5)
nRBC: 0 % (ref 0.0–0.2)

## 2019-12-22 LAB — MAGNESIUM: Magnesium: 1.8 mg/dL (ref 1.7–2.4)

## 2019-12-22 NOTE — Progress Notes (Signed)
Pulmonary Critical Care Medicine [facility] [place]   PULMONARY CRITICAL CARE SERVICE  PROGRESS NOTE  Date of Service: 12/22/2019  Susan [name]  UJW:[id number]  DOB: 03-03-1975   DOA: 12/14/2019  Referring Physician: Carron Curie, MD  HPI: Susan [name] is a 44 y.o. female seen for follow up of Acute on Chronic Respiratory Failure.  Patient currently is on pressure support mode has been on 40% FiO2 and is weaning on a pressure of 12/5  Medications: Reviewed on Rounds  Physical Exam:  Vitals: Temperature is 97.8 pulse 88 respiratory rate 26 blood pressure is 111/85 saturations 100%  Ventilator Settings on pressure support FiO2 40% pressure 12/5  . General: Comfortable at this time . Eyes: Grossly normal lids, irises & conjunctiva . ENT: grossly tongue is normal . Neck: no obvious mass . Cardiovascular: S1 S2 normal no gallop . Respiratory: Scattered rhonchi are noted bilaterally . Abdomen: soft . Skin: no rash seen on limited exam . Musculoskeletal: not rigid . Psychiatric:unable to assess . Neurologic: no seizure no involuntary movements         Lab Data:   Basic Metabolic Panel: Recent Labs  Lab 12/16/19 0411 12/17/19 0722 12/18/19 0514 12/22/19 0629  NA 138 137 139 138  K 4.1 4.1 3.7 3.8  CL 98 98 98 99  CO2 29 28 31 29   GLUCOSE 111* 144* 130* 127*  BUN 19 20 16 13   CREATININE 0.45 0.59 0.55 0.62  CALCIUM 9.7 9.7 9.7 9.8  MG 2.1 2.0  --  1.8  PHOS 4.0 4.7* 5.5*  --     ABG: Recent Labs  Lab 12/15/19 1220  PHART 7.404  PCO2ART 45.7  PO2ART 126*  HCO3 28.1*  O2SAT 98.6    Liver Function Tests: Recent Labs  Lab 12/18/19 0514  ALBUMIN 2.8*   No results for input(s): LIPASE, AMYLASE in the last 168 hours. No results for input(s): AMMONIA in the last 168 hours.  CBC: Recent Labs  Lab 12/16/19 0411 12/17/19 0722 12/18/19 0514 12/22/19 0629  WBC 16.7* 13.1* 11.2* 11.3*  HGB 11.1* 10.7* 11.0* 11.7*  HCT 36.8 35.0* 36.4  37.3  MCV 82.0 82.7 83.1 84.0  PLT 356 316 316 332    Cardiac Enzymes: No results for input(s): CKTOTAL, CKMB, CKMBINDEX, TROPONINI in the last 168 hours.  BNP (last 3 results) No results for input(s): BNP in the last 8760 hours.  ProBNP (last 3 results) No results for input(s): PROBNP in the last 8760 hours.  Radiological Exams: No results found.  Assessment/Plan Active Problems:   Acute on chronic respiratory failure with hypoxia (HCC)   COVID-19 virus infection   Pneumonia due to COVID-19 virus   Pneumomediastinum (HCC)   1. Acute on chronic respiratory failure with hypoxia patient is on weaning protocol currently on pressure support 12/5 titrate oxygen and titrate the pressure support as tolerated. 2. COVID-19 virus infection in recovery phase we will continue to follow 3. Pneumonia due to COVID-19 slow improvement 4. Pneumomediastinum Resolvedno change we will continue to follow along. 5. Paroxysmal supraventricular tachycardia being seen by cardiology low-dose beta-blocker   I have personally seen and evaluated the patient, evaluated laboratory and imaging results, formulated the assessment and plan and placed orders. The Patient requires high complexity decision making with multiple systems involvement.  Rounds were done with the Respiratory Therapy Director and Staff therapists and discussed with nursing staff also.  12/24/19, MD [facility] Pulmonary Critical Care Medicine Sleep Medicine

## 2019-12-22 NOTE — Consult Note (Signed)
Ref: Calvert Cantor, NP   Subjective:  Awake. No chest pain. HR in 90's tp 100's.  Objective:  Vital Signs in the last 24 hours: BP: 130/81  P: 103, R: 32, O2 sat 98 %.   Physical Exam: BP Readings from Last 1 Encounters:  No data found for BP     Wt Readings from Last 1 Encounters:  No data found for Wt    Weight change:  There is no height or weight on file to calculate BMI. HEENT: [place]/AT, Eyes-Blue, Conjunctiva-Pink, Sclera-Non-icteric Neck: No JVD, No bruit, Trachea midline. Lungs:  Clearing, Bilateral. Cardiac:  Regular rhythm, normal S1 and S2, no S3. II/VI systolic murmur. Abdomen:  Soft, non-tender. BS present. Extremities:  Trace edema present. No cyanosis. No clubbing. CNS: AxOx2, Cranial nerves grossly intact, moves all 4 extremities.  Skin: Warm and dry.   Intake/Output from previous day: No intake/output data recorded.    Lab Results: BMET    Component Value Date/Time   NA 138 12/22/2019 0629   NA 139 12/18/2019 0514   NA 137 12/17/2019 0722   K 3.8 12/22/2019 0629   K 3.7 12/18/2019 0514   K 4.1 12/17/2019 0722   CL 99 12/22/2019 0629   CL 98 12/18/2019 0514   CL 98 12/17/2019 0722   CO2 29 12/22/2019 0629   CO2 31 12/18/2019 0514   CO2 28 12/17/2019 0722   GLUCOSE 127 (H) 12/22/2019 0629   GLUCOSE 130 (H) 12/18/2019 0514   GLUCOSE 144 (H) 12/17/2019 0722   BUN 13 12/22/2019 0629   BUN 16 12/18/2019 0514   BUN 20 12/17/2019 0722   CREATININE 0.62 12/22/2019 0629   CREATININE 0.55 12/18/2019 0514   CREATININE 0.59 12/17/2019 0722   CALCIUM 9.8 12/22/2019 0629   CALCIUM 9.7 12/18/2019 0514   CALCIUM 9.7 12/17/2019 0722   GFRNONAA >60 12/22/2019 0629   GFRNONAA >60 12/18/2019 0514   GFRNONAA >60 12/17/2019 0722   CBC    Component Value Date/Time   WBC 11.3 (H) 12/22/2019 0629   RBC 4.44 12/22/2019 0629   HGB 11.7 (L) 12/22/2019 0629   HCT 37.3 12/22/2019 0629   PLT 332 12/22/2019 0629   MCV 84.0 12/22/2019 0629   MCH 26.4  12/22/2019 0629   MCHC 31.4 12/22/2019 0629   RDW 23.0 (H) 12/22/2019 0629   HEPATIC Function Panel Recent Labs    12/15/19 0447  PROT 6.6   HEMOGLOBIN A1C No components found for: HGA1C,  MPG CARDIAC ENZYMES No results found for: CKTOTAL, CKMB, CKMBINDEX, TROPONINI BNP No results for input(s): PROBNP in the last 8760 hours. TSH Recent Labs    12/16/19 0411  TSH 1.583   CHOLESTEROL No results for input(s): CHOL in the last 8760 hours.  Scheduled Meds: Continuous Infusions: PRN Meds:.iopamidol  Assessment/Plan: Acute on chronic respiratory failure Sinus tachycardia Paroxysmal SVT S/P COVID pneumonia Anxiety  Increase diltiazem to 120 mg. SR bid.   LOS: 0 days   Time spent including chart review, lab review, examination, discussion with patient : 25 min   Orpah Cobb  MD  12/22/2019, 9:22 PM

## 2019-12-23 DIAGNOSIS — J1282 Pneumonia due to coronavirus disease 2019: Secondary | ICD-10-CM | POA: Diagnosis not present

## 2019-12-23 DIAGNOSIS — U071 COVID-19: Secondary | ICD-10-CM | POA: Diagnosis not present

## 2019-12-23 DIAGNOSIS — J982 Interstitial emphysema: Secondary | ICD-10-CM | POA: Diagnosis not present

## 2019-12-23 DIAGNOSIS — J9621 Acute and chronic respiratory failure with hypoxia: Secondary | ICD-10-CM | POA: Diagnosis not present

## 2019-12-23 NOTE — Progress Notes (Signed)
Pulmonary Critical Care Medicine [facility] [place]   PULMONARY CRITICAL CARE SERVICE  PROGRESS NOTE  Date of Service: 12/23/2019  Susan [name]  LKG:[id number]  DOB: 1975-11-23   DOA: 12/14/2019  Referring Physician: Carron Curie, MD  HPI: Susan [name] is a 44 y.o. female seen for follow up of Acute on Chronic Respiratory Failure.  Patient is weaning on pressure support today's goal is for 16 hours  Medications: Reviewed on Rounds  Physical Exam:  Vitals: Temperature is 96.8 pulse 99 respiratory 23 blood pressure is 119/70 saturations 100%  Ventilator Settings on pressure support FiO2 45% pressure 12/5  . General: Comfortable at this time . Eyes: Grossly normal lids, irises & conjunctiva . ENT: grossly tongue is normal . Neck: no obvious mass . Cardiovascular: S1 S2 normal no gallop . Respiratory: No rhonchi very coarse breath sounds . Abdomen: soft . Skin: no rash seen on limited exam . Musculoskeletal: not rigid . Psychiatric:unable to assess . Neurologic: no seizure no involuntary movements         Lab Data:   Basic Metabolic Panel: Recent Labs  Lab 12/17/19 0722 12/18/19 0514 12/22/19 0629  NA 137 139 138  K 4.1 3.7 3.8  CL 98 98 99  CO2 28 31 29   GLUCOSE 144* 130* 127*  BUN 20 16 13   CREATININE 0.59 0.55 0.62  CALCIUM 9.7 9.7 9.8  MG 2.0  --  1.8  PHOS 4.7* 5.5*  --     ABG: No results for input(s): PHART, PCO2ART, PO2ART, HCO3, O2SAT in the last 168 hours.  Liver Function Tests: Recent Labs  Lab 12/18/19 0514  ALBUMIN 2.8*   No results for input(s): LIPASE, AMYLASE in the last 168 hours. No results for input(s): AMMONIA in the last 168 hours.  CBC: Recent Labs  Lab 12/17/19 0722 12/18/19 0514 12/22/19 0629  WBC 13.1* 11.2* 11.3*  HGB 10.7* 11.0* 11.7*  HCT 35.0* 36.4 37.3  MCV 82.7 83.1 84.0  PLT 316 316 332    Cardiac Enzymes: No results for input(s): CKTOTAL, CKMB, CKMBINDEX, TROPONINI in the last 168  hours.  BNP (last 3 results) No results for input(s): BNP in the last 8760 hours.  ProBNP (last 3 results) No results for input(s): PROBNP in the last 8760 hours.  Radiological Exams: No results found.  Assessment/Plan Active Problems:   Acute on chronic respiratory failure with hypoxia (HCC)   COVID-19 virus infection   Pneumonia due to COVID-19 virus   Pneumomediastinum (HCC)   1. Acute on chronic respiratory failure with hypoxia we will continue to advance slowly as tolerated.  Patient's goal is 16 hours pressure support today 2. COVID-19 virus infection in recovery phase 3. Pneumonia due to COVID-19 slow improvement 4. Pneumomediastinum resolved   I have personally seen and evaluated the patient, evaluated laboratory and imaging results, formulated the assessment and plan and placed orders. The Patient requires high complexity decision making with multiple systems involvement.  Rounds were done with the Respiratory Therapy Director and Staff therapists and discussed with nursing staff also.  12/20/19, MD [facility] Pulmonary Critical Care Medicine Sleep Medicine

## 2019-12-24 DIAGNOSIS — J9621 Acute and chronic respiratory failure with hypoxia: Secondary | ICD-10-CM | POA: Diagnosis not present

## 2019-12-24 DIAGNOSIS — U071 COVID-19: Secondary | ICD-10-CM | POA: Diagnosis not present

## 2019-12-24 DIAGNOSIS — J1282 Pneumonia due to coronavirus disease 2019: Secondary | ICD-10-CM | POA: Diagnosis not present

## 2019-12-24 DIAGNOSIS — J982 Interstitial emphysema: Secondary | ICD-10-CM | POA: Diagnosis not present

## 2019-12-24 LAB — BASIC METABOLIC PANEL
Anion gap: 11 (ref 5–15)
BUN: 13 mg/dL (ref 6–20)
CO2: 27 mmol/L (ref 22–32)
Calcium: 9.9 mg/dL (ref 8.9–10.3)
Chloride: 102 mmol/L (ref 98–111)
Creatinine, Ser: 0.54 mg/dL (ref 0.44–1.00)
GFR, Estimated: >60 mL/min (ref >60)
Glucose, Bld: 144 mg/dL — ABNORMAL HIGH (ref 70–99)
Potassium: 3.7 mmol/L (ref 3.5–5.1)
Sodium: 140 mmol/L (ref 135–145)

## 2019-12-24 LAB — BLOOD GAS, ARTERIAL
Acid-Base Excess: 5.3 mmol/L — ABNORMAL HIGH (ref 0.0–2.0)
Bicarbonate: 29.2 mmol/L — ABNORMAL HIGH (ref 20.0–28.0)
FIO2: 28
O2 Saturation: 96.9 %
Patient temperature: 36.7
pCO2 arterial: 41.2 mmHg (ref 32.0–48.0)
pH, Arterial: 7.462 — ABNORMAL HIGH (ref 7.350–7.450)
pO2, Arterial: 86 mmHg (ref 83.0–108.0)

## 2019-12-24 LAB — CBC
HCT: 39.2 % (ref 36.0–46.0)
Hemoglobin: 12.1 g/dL (ref 12.0–15.0)
MCH: 26.5 pg (ref 26.0–34.0)
MCHC: 30.9 g/dL (ref 30.0–36.0)
MCV: 86 fL (ref 80.0–100.0)
Platelets: 321 10*3/uL (ref 150–400)
RBC: 4.56 MIL/uL (ref 3.87–5.11)
RDW: 23.2 % — ABNORMAL HIGH (ref 11.5–15.5)
WBC: 10.1 10*3/uL (ref 4.0–10.5)
nRBC: 0 % (ref 0.0–0.2)

## 2019-12-24 LAB — MAGNESIUM: Magnesium: 1.9 mg/dL (ref 1.7–2.4)

## 2019-12-24 NOTE — Progress Notes (Signed)
Pulmonary Critical Care Medicine [facility] [place]   PULMONARY CRITICAL CARE SERVICE  PROGRESS NOTE  Date of Service: 12/24/2019  Susan [name]  [id number]  DOB: 1976/02/14   DOA: 12/14/2019  Referring Physician: Carron Curie, MD  HPI: Susan [name] is a 44 y.o. female seen for follow up of Acute on Chronic Respiratory Failure.  Patient is started on T collar currently on 20% FiO2  Medications: Reviewed on Rounds  Physical Exam:  Vitals: Temperature is 96.0 pulse 105 respiratory rate 30 blood pressure is 119/81 saturations 99%  Ventilator Settings on T collar 28% FiO2  . General: Comfortable at this time . Eyes: Grossly normal lids, irises & conjunctiva . ENT: grossly tongue is normal . Neck: no obvious mass . Cardiovascular: S1 S2 normal no gallop . Respiratory: No rhonchi no rales are noted at this time . Abdomen: soft . Skin: no rash seen on limited exam . Musculoskeletal: not rigid . Psychiatric:unable to assess . Neurologic: no seizure no involuntary movements         Lab Data:   Basic Metabolic Panel: Recent Labs  Lab 12/18/19 0514 12/22/19 0629  NA 139 138  K 3.7 3.8  CL 98 99  CO2 31 29  GLUCOSE 130* 127*  BUN 16 13  CREATININE 0.55 0.62  CALCIUM 9.7 9.8  MG  --  1.8  PHOS 5.5*  --     ABG: No results for input(s): PHART, PCO2ART, PO2ART, HCO3, O2SAT in the last 168 hours.  Liver Function Tests: Recent Labs  Lab 12/18/19 0514  ALBUMIN 2.8*   No results for input(s): LIPASE, AMYLASE in the last 168 hours. No results for input(s): AMMONIA in the last 168 hours.  CBC: Recent Labs  Lab 12/18/19 0514 12/22/19 0629  WBC 11.2* 11.3*  HGB 11.0* 11.7*  HCT 36.4 37.3  MCV 83.1 84.0  PLT 316 332    Cardiac Enzymes: No results for input(s): CKTOTAL, CKMB, CKMBINDEX, TROPONINI in the last 168 hours.  BNP (last 3 results) No results for input(s): BNP in the last 8760 hours.  ProBNP (last 3 results) No results for  input(s): PROBNP in the last 8760 hours.  Radiological Exams: No results found.  Assessment/Plan Active Problems:   Acute on chronic respiratory failure with hypoxia (HCC)   COVID-19 virus infection   Pneumonia due to COVID-19 virus   Pneumomediastinum (HCC)   1. Acute on chronic respiratory failure with hypoxia plan is to continue with T collar patient is on 20% FiO2 2. COVID-19 virus infection in recovery phase we will continue to follow along. 3. Pneumonia due to COVID-19 slow improvement 4. Pneumomediastinum follow-up radiologically   I have personally seen and evaluated the patient, evaluated laboratory and imaging results, formulated the assessment and plan and placed orders. The Patient requires high complexity decision making with multiple systems involvement.  Rounds were done with the Respiratory Therapy Director and Staff therapists and discussed with nursing staff also.  Yevonne Pax, MD [facility] Pulmonary Critical Care Medicine Sleep Medicine

## 2019-12-25 DIAGNOSIS — J9621 Acute and chronic respiratory failure with hypoxia: Secondary | ICD-10-CM | POA: Diagnosis not present

## 2019-12-25 DIAGNOSIS — J982 Interstitial emphysema: Secondary | ICD-10-CM | POA: Diagnosis not present

## 2019-12-25 DIAGNOSIS — J1282 Pneumonia due to coronavirus disease 2019: Secondary | ICD-10-CM | POA: Diagnosis not present

## 2019-12-25 DIAGNOSIS — U071 COVID-19: Secondary | ICD-10-CM | POA: Diagnosis not present

## 2019-12-25 LAB — BLOOD GAS, ARTERIAL
Acid-Base Excess: 6.1 mmol/L — ABNORMAL HIGH (ref 0.0–2.0)
Bicarbonate: 30.2 mmol/L — ABNORMAL HIGH (ref 20.0–28.0)
FIO2: 60
O2 Saturation: 94.8 %
Patient temperature: 37
pCO2 arterial: 44.4 mmHg (ref 32.0–48.0)
pH, Arterial: 7.447 (ref 7.350–7.450)
pO2, Arterial: 75.1 mmHg — ABNORMAL LOW (ref 83.0–108.0)

## 2019-12-25 NOTE — Progress Notes (Signed)
Pulmonary Critical Care Medicine [facility] [place]   PULMONARY CRITICAL CARE SERVICE  PROGRESS NOTE  Date of Service: 12/25/2019  Susan [name]  [id number]:[id number]  DOB: [birth year]   DOA: 12/14/2019  Referring Physician: Carron Curie, MD  HPI: Susan [name] is a 44 y.o. female seen for follow up of Acute on Chronic Respiratory Failure.  Patient is on T collar currently on 35% FiO2 has been tolerating it fairly well.  Medications: Reviewed on Rounds  Physical Exam:  Vitals: Temperature is 96.2 pulse 117 respiratory rate 34 blood pressure is 116/64 saturations 98%  Ventilator Settings on T collar with an FiO2 of 35%  . General: Comfortable at this time . Eyes: Grossly normal lids, irises & conjunctiva . ENT: grossly tongue is normal . Neck: no obvious mass . Cardiovascular: S1 S2 normal no gallop . Respiratory: No rales are noted at this time . Abdomen: soft . Skin: no rash seen on limited exam . Musculoskeletal: not rigid . Psychiatric:unable to assess . Neurologic: no seizure no involuntary movements         Lab Data:   Basic Metabolic Panel: Recent Labs  Lab 12/22/19 0629 12/24/19 1133  NA 138 140  K 3.8 3.7  CL 99 102  CO2 29 27  GLUCOSE 127* 144*  BUN 13 13  CREATININE 0.62 0.54  CALCIUM 9.8 9.9  MG 1.8 1.9    ABG: Recent Labs  Lab 12/24/19 1050  PHART 7.462*  PCO2ART 41.2  PO2ART 86.0  HCO3 29.2*  O2SAT 96.9    Liver Function Tests: No results for input(s): AST, ALT, ALKPHOS, BILITOT, PROT, ALBUMIN in the last 168 hours. No results for input(s): LIPASE, AMYLASE in the last 168 hours. No results for input(s): AMMONIA in the last 168 hours.  CBC: Recent Labs  Lab 12/22/19 0629 12/24/19 1133  WBC 11.3* 10.1  HGB 11.7* 12.1  HCT 37.3 39.2  MCV 84.0 86.0  PLT 332 321    Cardiac Enzymes: No results for input(s): CKTOTAL, CKMB, CKMBINDEX, TROPONINI in the last 168 hours.  BNP (last 3 results) No results for input(s): BNP  in the last 8760 hours.  ProBNP (last 3 results) No results for input(s): PROBNP in the last 8760 hours.  Radiological Exams: No results found.  Assessment/Plan Active Problems:   Acute on chronic respiratory failure with hypoxia (HCC)   COVID-19 virus infection   Pneumonia due to COVID-19 virus   Pneumomediastinum (HCC)   1. Acute on chronic respiratory failure hypoxia plan is to continue with T collar titrate oxygen as tolerated currently on 35% FiO2. 2. COVID-19 virus infection in recovery phase we will continue to follow 3. Pneumonia due to COVID-19 treated 4. Pneumomediastinum follow radiologically   I have personally seen and evaluated the patient, evaluated laboratory and imaging results, formulated the assessment and plan and placed orders. The Patient requires high complexity decision making with multiple systems involvement.  Rounds were done with the Respiratory Therapy Director and Staff therapists and discussed with nursing staff also.  Yevonne Pax, MD [facility] Pulmonary Critical Care Medicine Sleep Medicine

## 2019-12-26 ENCOUNTER — Other Ambulatory Visit (HOSPITAL_COMMUNITY): Payer: BC Managed Care – PPO

## 2019-12-26 DIAGNOSIS — J982 Interstitial emphysema: Secondary | ICD-10-CM | POA: Diagnosis not present

## 2019-12-26 DIAGNOSIS — J9621 Acute and chronic respiratory failure with hypoxia: Secondary | ICD-10-CM | POA: Diagnosis not present

## 2019-12-26 DIAGNOSIS — J1282 Pneumonia due to coronavirus disease 2019: Secondary | ICD-10-CM | POA: Diagnosis not present

## 2019-12-26 DIAGNOSIS — U071 COVID-19: Secondary | ICD-10-CM | POA: Diagnosis not present

## 2019-12-26 LAB — CBC
HCT: 41.8 % (ref 36.0–46.0)
Hemoglobin: 12.7 g/dL (ref 12.0–15.0)
MCH: 26.1 pg (ref 26.0–34.0)
MCHC: 30.4 g/dL (ref 30.0–36.0)
MCV: 85.8 fL (ref 80.0–100.0)
Platelets: 347 10*3/uL (ref 150–400)
RBC: 4.87 MIL/uL (ref 3.87–5.11)
RDW: 23.5 % — ABNORMAL HIGH (ref 11.5–15.5)
WBC: 14.6 10*3/uL — ABNORMAL HIGH (ref 4.0–10.5)
nRBC: 0 % (ref 0.0–0.2)

## 2019-12-26 LAB — MAGNESIUM: Magnesium: 2 mg/dL (ref 1.7–2.4)

## 2019-12-26 LAB — PHOSPHORUS: Phosphorus: 5 mg/dL — ABNORMAL HIGH (ref 2.5–4.6)

## 2019-12-26 LAB — BASIC METABOLIC PANEL
Anion gap: 13 (ref 5–15)
BUN: 14 mg/dL (ref 6–20)
CO2: 29 mmol/L (ref 22–32)
Calcium: 9.9 mg/dL (ref 8.9–10.3)
Chloride: 98 mmol/L (ref 98–111)
Creatinine, Ser: 0.51 mg/dL (ref 0.44–1.00)
GFR, Estimated: >60 mL/min (ref >60)
Glucose, Bld: 98 mg/dL (ref 70–99)
Potassium: 4.6 mmol/L (ref 3.5–5.1)
Sodium: 140 mmol/L (ref 135–145)

## 2019-12-26 NOTE — Progress Notes (Signed)
Pulmonary Critical Care Medicine [facility] [place]   PULMONARY CRITICAL CARE SERVICE  PROGRESS NOTE  Date of Service: 12/26/2019  Susan [name]  NKN:[id number]  DOB: 21-May-1975   DOA: 12/14/2019  Referring Physician: Carron Curie, MD  HPI: Susan [name] is a 44 y.o. female seen for follow up of Acute on Chronic Respiratory Failure.  Patient at this time is off the ventilator on T collar currently is on 35% FiO2 with good saturations  Medications: Reviewed on Rounds  Physical Exam:  Vitals: Temperature is 97.9 pulse 110 respiratory rate 30 blood pressure is 135/82 saturations 96%  Ventilator Settings currently on T collar with an FiO2 35%  . General: Comfortable at this time . Eyes: Grossly normal lids, irises & conjunctiva . ENT: grossly tongue is normal . Neck: no obvious mass . Cardiovascular: S1 S2 normal no gallop . Respiratory: No rhonchi very coarse breath sounds . Abdomen: soft . Skin: no rash seen on limited exam . Musculoskeletal: not rigid . Psychiatric:unable to assess . Neurologic: no seizure no involuntary movements         Lab Data:   Basic Metabolic Panel: Recent Labs  Lab 12/22/19 0629 12/24/19 1133 12/26/19 0501  NA 138 140 140  K 3.8 3.7 4.6  CL 99 102 98  CO2 29 27 29   GLUCOSE 127* 144* 98  BUN 13 13 14   CREATININE 0.62 0.54 0.51  CALCIUM 9.8 9.9 9.9  MG 1.8 1.9 2.0  PHOS  --   --  5.0*    ABG: Recent Labs  Lab 12/24/19 1050 12/25/19 2045  PHART 7.462* 7.447  PCO2ART 41.2 44.4  PO2ART 86.0 75.1*  HCO3 29.2* 30.2*  O2SAT 96.9 94.8    Liver Function Tests: No results for input(s): AST, ALT, ALKPHOS, BILITOT, PROT, ALBUMIN in the last 168 hours. No results for input(s): LIPASE, AMYLASE in the last 168 hours. No results for input(s): AMMONIA in the last 168 hours.  CBC: Recent Labs  Lab 12/22/19 0629 12/24/19 1133 12/26/19 0501  WBC 11.3* 10.1 14.6*  HGB 11.7* 12.1 12.7  HCT 37.3 39.2 41.8  MCV 84.0  86.0 85.8  PLT 332 321 347    Cardiac Enzymes: No results for input(s): CKTOTAL, CKMB, CKMBINDEX, TROPONINI in the last 168 hours.  BNP (last 3 results) No results for input(s): BNP in the last 8760 hours.  ProBNP (last 3 results) No results for input(s): PROBNP in the last 8760 hours.  Radiological Exams: DG CHEST PORT 1 VIEW  Result Date: 12/26/2019 CLINICAL DATA:  Pneumonia. EXAM: PORTABLE CHEST 1 VIEW COMPARISON:  October 15, 2019. FINDINGS: The heart size and mediastinal contours are within normal limits. Tracheostomy tube is in grossly good position. No pneumothorax or pleural effusion is noted. Stable patchy airspace opacities are noted concerning for multifocal pneumonia. The visualized skeletal structures are unremarkable. IMPRESSION: Stable bilateral multifocal pneumonia. Electronically Signed   By: 12/28/2019 M.D.   On: 12/26/2019 12:55    Assessment/Plan Active Problems:   Acute on chronic respiratory failure with hypoxia (HCC)   COVID-19 virus infection   Pneumonia due to COVID-19 virus   Pneumomediastinum (HCC)   1. Acute on chronic respiratory failure with hypoxia we will continue T collar trials titrate oxygen continue pulmonary toilet. 2. COVID-19 virus infection in recovery phase we will continue to follow 3. Pneumonia due to COVID-19 treated clinically is improving 4. Pneumomediastinum follow-up chest x-ray reveals stable multifocal pneumonia   I have personally seen and evaluated  the patient, evaluated laboratory and imaging results, formulated the assessment and plan and placed orders. The Patient requires high complexity decision making with multiple systems involvement.  Rounds were done with the Respiratory Therapy Director and Staff therapists and discussed with nursing staff also.  Allyne Gee, MD [facility] Pulmonary Critical Care Medicine Sleep Medicine

## 2019-12-27 DIAGNOSIS — U071 COVID-19: Secondary | ICD-10-CM | POA: Diagnosis not present

## 2019-12-27 DIAGNOSIS — J1282 Pneumonia due to coronavirus disease 2019: Secondary | ICD-10-CM | POA: Diagnosis not present

## 2019-12-27 DIAGNOSIS — J9621 Acute and chronic respiratory failure with hypoxia: Secondary | ICD-10-CM | POA: Diagnosis not present

## 2019-12-27 DIAGNOSIS — J982 Interstitial emphysema: Secondary | ICD-10-CM | POA: Diagnosis not present

## 2019-12-27 NOTE — Progress Notes (Signed)
Pulmonary Critical Care Medicine [facility] [place]   PULMONARY CRITICAL CARE SERVICE  PROGRESS NOTE  Date of Service: 12/27/2019  Susan [name]  [id number]  DOB: 10-Jan-1976   DOA: 12/14/2019  Referring Physician: Carron Curie, MD  HPI: Susan [name] is a 44 y.o. female seen for follow up of Acute on Chronic Respiratory Failure.  Patient currently is on pressure support 12/5 requiring 35% FiO2  Medications: Reviewed on Rounds  Physical Exam:  Vitals: Temperature 97.3 pulse 111 respiratory 29 blood pressure is 104/64 saturations 99%  Ventilator Settings currently on pressure support will be trying T collar today  . General: Comfortable at this time . Eyes: Grossly normal lids, irises & conjunctiva . ENT: grossly tongue is normal . Neck: no obvious mass . Cardiovascular: S1 S2 normal no gallop . Respiratory: No rhonchi coarse breath sounds . Abdomen: soft . Skin: no rash seen on limited exam . Musculoskeletal: not rigid . Psychiatric:unable to assess . Neurologic: no seizure no involuntary movements         Lab Data:   Basic Metabolic Panel: Recent Labs  Lab 12/22/19 0629 12/24/19 1133 12/26/19 0501  NA 138 140 140  K 3.8 3.7 4.6  CL 99 102 98  CO2 29 27 29   GLUCOSE 127* 144* 98  BUN 13 13 14   CREATININE 0.62 0.54 0.51  CALCIUM 9.8 9.9 9.9  MG 1.8 1.9 2.0  PHOS  --   --  5.0*    ABG: Recent Labs  Lab 12/24/19 1050 12/25/19 2045  PHART 7.462* 7.447  PCO2ART 41.2 44.4  PO2ART 86.0 75.1*  HCO3 29.2* 30.2*  O2SAT 96.9 94.8    Liver Function Tests: No results for input(s): AST, ALT, ALKPHOS, BILITOT, PROT, ALBUMIN in the last 168 hours. No results for input(s): LIPASE, AMYLASE in the last 168 hours. No results for input(s): AMMONIA in the last 168 hours.  CBC: Recent Labs  Lab 12/22/19 0629 12/24/19 1133 12/26/19 0501  WBC 11.3* 10.1 14.6*  HGB 11.7* 12.1 12.7  HCT 37.3 39.2 41.8  MCV 84.0 86.0 85.8  PLT 332 321 347     Cardiac Enzymes: No results for input(s): CKTOTAL, CKMB, CKMBINDEX, TROPONINI in the last 168 hours.  BNP (last 3 results) No results for input(s): BNP in the last 8760 hours.  ProBNP (last 3 results) No results for input(s): PROBNP in the last 8760 hours.  Radiological Exams: DG CHEST PORT 1 VIEW  Result Date: 12/26/2019 CLINICAL DATA:  Pneumonia. EXAM: PORTABLE CHEST 1 VIEW COMPARISON:  October 15, 2019. FINDINGS: The heart size and mediastinal contours are within normal limits. Tracheostomy tube is in grossly good position. No pneumothorax or pleural effusion is noted. Stable patchy airspace opacities are noted concerning for multifocal pneumonia. The visualized skeletal structures are unremarkable. IMPRESSION: Stable bilateral multifocal pneumonia. Electronically Signed   By: 12/28/2019 M.D.   On: 12/26/2019 12:55    Assessment/Plan Active Problems:   Acute on chronic respiratory failure with hypoxia (HCC)   COVID-19 virus infection   Pneumonia due to COVID-19 virus   Pneumomediastinum (HCC)   1. Acute on chronic respiratory failure hypoxia we will try to advance weaning to T collar once again today anxiety remains a major issue. 2. COVID-19 virus infection in recovery phase we will continue with supportive care. 3. Pneumonia due to COVID-19 treated patient has residual pulmonary damage from the pneumonitis. 4. Pneumomediastinum resolved we will continue with supportive care   I have personally seen and  evaluated the patient, evaluated laboratory and imaging results, formulated the assessment and plan and placed orders. The Patient requires high complexity decision making with multiple systems involvement.  Rounds were done with the Respiratory Therapy Director and Staff therapists and discussed with nursing staff also.  Allyne Gee, MD [facility] Pulmonary Critical Care Medicine Sleep Medicine

## 2019-12-27 NOTE — Consult Note (Signed)
Ref: Calvert Cantor, NP   Subjective:  Tachycardic post cough and anxiety.  On 150 mg. of metoprolol in divided doses and.on 240 mg of diltiazem in divided doses.  Objective:  Vital Signs in the last 24 hours: BP: 130/80  P: 115 R: 24 O2 sat 100 % on 35 % oxygen.   Physical Exam: BP Readings from Last 1 Encounters:  No data found for BP     Wt Readings from Last 1 Encounters:  No data found for Wt    Weight change:  There is no height or weight on file to calculate BMI. HEENT: [place]/AT, Eyes-Blue, Conjunctiva-Pink, Sclera-Non-icteric Neck: No JVD, No bruit, Trachea midline. Lungs:  Scattered rhonchi, Bilateral. Cardiac:  Regular rhythm, normal S1 and S2, no S3. II/VI systolic murmur. Abdomen:  Soft, non-tender. BS present. Extremities:  Trace edema present. No cyanosis. No clubbing. CNS: AxOx2, Cranial nerves grossly intact, moves all 4 extremities.  Skin: Warm and dry.   Intake/Output from previous day: No intake/output data recorded.    Lab Results: BMET    Component Value Date/Time   NA 140 12/26/2019 0501   NA 140 12/24/2019 1133   NA 138 12/22/2019 0629   K 4.6 12/26/2019 0501   K 3.7 12/24/2019 1133   K 3.8 12/22/2019 0629   CL 98 12/26/2019 0501   CL 102 12/24/2019 1133   CL 99 12/22/2019 0629   CO2 29 12/26/2019 0501   CO2 27 12/24/2019 1133   CO2 29 12/22/2019 0629   GLUCOSE 98 12/26/2019 0501   GLUCOSE 144 (H) 12/24/2019 1133   GLUCOSE 127 (H) 12/22/2019 0629   BUN 14 12/26/2019 0501   BUN 13 12/24/2019 1133   BUN 13 12/22/2019 0629   CREATININE 0.51 12/26/2019 0501   CREATININE 0.54 12/24/2019 1133   CREATININE 0.62 12/22/2019 0629   CALCIUM 9.9 12/26/2019 0501   CALCIUM 9.9 12/24/2019 1133   CALCIUM 9.8 12/22/2019 0629   GFRNONAA >60 12/26/2019 0501   GFRNONAA >60 12/24/2019 1133   GFRNONAA >60 12/22/2019 0629   CBC    Component Value Date/Time   WBC 14.6 (H) 12/26/2019 0501   RBC 4.87 12/26/2019 0501   HGB 12.7 12/26/2019 0501    HCT 41.8 12/26/2019 0501   PLT 347 12/26/2019 0501   MCV 85.8 12/26/2019 0501   MCH 26.1 12/26/2019 0501   MCHC 30.4 12/26/2019 0501   RDW 23.5 (H) 12/26/2019 0501   HEPATIC Function Panel Recent Labs    12/15/19 0447  PROT 6.6   HEMOGLOBIN A1C No components found for: HGA1C,  MPG CARDIAC ENZYMES No results found for: CKTOTAL, CKMB, CKMBINDEX, TROPONINI BNP No results for input(s): PROBNP in the last 8760 hours. TSH Recent Labs    12/16/19 0411  TSH 1.583   CHOLESTEROL No results for input(s): CHOL in the last 8760 hours.  Scheduled Meds: Continuous Infusions: PRN Meds:.iopamidol  Assessment/Plan: Acute on chronic respiratory failure with hypoxia Sinus tachycardia Paroxysmal SVT S/P COVID pneumonia Anxiety  Increase clonazepam to q 6 hour. Continue diltiazem 60 mg. q 6 hr and metoprolol 50 mg. q 8 hour.   LOS: 0 days   Time spent including chart review, lab review, examination, discussion with patient, family member, nurse and tech : 30 min   Orpah Cobb  MD  12/27/2019, 9:35 PM

## 2019-12-28 DIAGNOSIS — J982 Interstitial emphysema: Secondary | ICD-10-CM | POA: Diagnosis not present

## 2019-12-28 DIAGNOSIS — J1282 Pneumonia due to coronavirus disease 2019: Secondary | ICD-10-CM | POA: Diagnosis not present

## 2019-12-28 DIAGNOSIS — J9621 Acute and chronic respiratory failure with hypoxia: Secondary | ICD-10-CM | POA: Diagnosis not present

## 2019-12-28 DIAGNOSIS — U071 COVID-19: Secondary | ICD-10-CM | POA: Diagnosis not present

## 2019-12-28 NOTE — Consult Note (Signed)
Ref: Calvert Cantor, NP   Subjective:  Awake. Decreased anxiety.  Objective:  Vital Signs in the last 24 hours: BP: 120/70  P: 112 R: 22 O2 sat 199 on 35 % O2.   Physical Exam: BP Readings from Last 1 Encounters:  No data found for BP     Wt Readings from Last 1 Encounters:  No data found for Wt    Weight change:  There is no height or weight on file to calculate BMI. HEENT: [place]/AT, Eyes-Blue, Conjunctiva-Pink, Sclera-Non-icteric Neck: No JVD, No bruit, Trachea midline. Lungs:  Scattered rhonchi, Bilateral. Cardiac:  Regular rhythm, normal S1 and S2, no S3. II/VI systolic murmur. Abdomen:  Soft, non-tender. BS present. Extremities:  No edema present. No cyanosis. No clubbing. CNS: AxOx2, Cranial nerves grossly intact, moves all 4 extremities.  Skin: Warm and dry.   Intake/Output from previous day: No intake/output data recorded.    Lab Results: BMET    Component Value Date/Time   NA 140 12/26/2019 0501   NA 140 12/24/2019 1133   NA 138 12/22/2019 0629   K 4.6 12/26/2019 0501   K 3.7 12/24/2019 1133   K 3.8 12/22/2019 0629   CL 98 12/26/2019 0501   CL 102 12/24/2019 1133   CL 99 12/22/2019 0629   CO2 29 12/26/2019 0501   CO2 27 12/24/2019 1133   CO2 29 12/22/2019 0629   GLUCOSE 98 12/26/2019 0501   GLUCOSE 144 (H) 12/24/2019 1133   GLUCOSE 127 (H) 12/22/2019 0629   BUN 14 12/26/2019 0501   BUN 13 12/24/2019 1133   BUN 13 12/22/2019 0629   CREATININE 0.51 12/26/2019 0501   CREATININE 0.54 12/24/2019 1133   CREATININE 0.62 12/22/2019 0629   CALCIUM 9.9 12/26/2019 0501   CALCIUM 9.9 12/24/2019 1133   CALCIUM 9.8 12/22/2019 0629   GFRNONAA >60 12/26/2019 0501   GFRNONAA >60 12/24/2019 1133   GFRNONAA >60 12/22/2019 0629   CBC    Component Value Date/Time   WBC 14.6 (H) 12/26/2019 0501   RBC 4.87 12/26/2019 0501   HGB 12.7 12/26/2019 0501   HCT 41.8 12/26/2019 0501   PLT 347 12/26/2019 0501   MCV 85.8 12/26/2019 0501   MCH 26.1 12/26/2019 0501    MCHC 30.4 12/26/2019 0501   RDW 23.5 (H) 12/26/2019 0501   HEPATIC Function Panel Recent Labs    12/15/19 0447  PROT 6.6   HEMOGLOBIN A1C No components found for: HGA1C,  MPG CARDIAC ENZYMES No results found for: CKTOTAL, CKMB, CKMBINDEX, TROPONINI BNP No results for input(s): PROBNP in the last 8760 hours. TSH Recent Labs    12/16/19 0411  TSH 1.583   CHOLESTEROL No results for input(s): CHOL in the last 8760 hours.  Scheduled Meds: Continuous Infusions: PRN Meds:.iopamidol  Assessment/Plan: Acute on chronic respiratory distress with hypoxia Sinus tachycardia Paroxysmal SVT S/P COVID pneumonia Anxiety  Continue medical treatment.   LOS: 0 days   Time spent including chart review, lab review, examination, discussion with patient and nurse : 30 min   Orpah Cobb  MD  12/28/2019, 11:08 AM

## 2019-12-28 NOTE — Consult Note (Signed)
Infectious Disease Consultation   Susan [name]  LAG:[id number]  DOB: 06/08/1975  DOA: 12/14/2019  Requesting physician: Dr. Manson Passey  Reason for consultation: Antibiotic recommendations   History of Present Illness: Susan [name] is an 44 y.o. female is a 44 year old female who was admitted to [facility] on 12/14/2019.  She presented to the acute facility due to worsening shortness of breath.  Patient apparently was hypoxemic and was diagnosed with COVID-19 infection and pneumonia.  She was admitted on 11/07/2019.  Her respiratory status worsened and she had to be intubated.  Her hospital course was complicated by pneumomediastinum, inability to wean.  Due to her complex medical problems [facility]  She had respiratory cultures collected on 12/15/2019 that showed a Pseudomonas aeruginosa.  She was treated with ceftazidime.  She completed treatment with ceftazidime p.m. however, now having fevers again.  Repeat respiratory cultures again showing Pseudomonas aeruginosa. She is on 35% FiO2.  She is having shortness of breath with increased secretions.  Denies having any chest pain, nausea, vomiting, diarrhea or dysuria.  Review of Systems:  Review of systems negative except as mentioned above in the HPI.   Past Medical History: Past Medical History:  Diagnosis Date   Acute on chronic respiratory failure with hypoxia (HCC)    COVID-19 virus infection    Pneumomediastinum (HCC)    Pneumonia due to COVID-19 virus   Anxiety, history of heart murmur  Past Surgical History: Cholecystectomy, hysterectomy, tonsillectomy, tubal ligation  Allergies: No known drug allergies  Social History: No history of smoking, alcohol or recreational drug abuse  Family History: Hypertension in mother, cancer father, asthma son, arthritis maternal grandmother, hyperlipidemia maternal grandmother  Physical Exam: Vitals: Temperature 97.8, pulse 112,  respiratory rate 20, blood pressure 136/80, pulse oximetry 98%  Constitutional: Ill-appearing female, awake. Head: Atraumatic, normocephalic Eyes: PERLA, EOMI  ENMT: external ears and nose appear normal, normal hearing, lips appears normal, moist oral mucosa  Neck: Has trach in place CVS: S1-S2  Respiratory: Rhonchi, no wheezing Abdomen: soft nontender, nondistended, normal bowel sounds  Musculoskeletal: Lower extremity edema Neuro: Following few commands, has debility with generalized weakness Psych: stable mood and affect Skin: no rashes  Data reviewed:  I have personally reviewed following labs and imaging studies Labs:  CBC: Recent Labs  Lab 12/22/19 0629 12/24/19 1133 12/26/19 0501  WBC 11.3* 10.1 14.6*  HGB 11.7* 12.1 12.7  HCT 37.3 39.2 41.8  MCV 84.0 86.0 85.8  PLT 332 321 347    Basic Metabolic Panel: Recent Labs  Lab 12/22/19 0629 12/22/19 0629 12/24/19 1133 12/26/19 0501  NA 138  --  140 140  K 3.8   < > 3.7 4.6  CL 99  --  102 98  CO2 29  --  27 29  GLUCOSE 127*  --  144* 98  BUN 13  --  13 14  CREATININE 0.62  --  0.54 0.51  CALCIUM 9.8  --  9.9 9.9  MG 1.8  --  1.9 2.0  PHOS  --   --   --  5.0*   < > = values in this interval not displayed.   GFR CrCl cannot be calculated (Unknown ideal weight.). Liver Function Tests: No results for input(s): AST, ALT, ALKPHOS, BILITOT, PROT, ALBUMIN in the last 168 hours. No results for input(s): LIPASE, AMYLASE in the last 168 hours. No results for input(s): AMMONIA in the last 168 hours. Coagulation profile No results for input(s): INR, PROTIME  in the last 168 hours.  Cardiac Enzymes: No results for input(s): CKTOTAL, CKMB, CKMBINDEX, TROPONINI in the last 168 hours. BNP: Invalid input(s): POCBNP CBG: No results for input(s): GLUCAP in the last 168 hours. D-Dimer No results for input(s): DDIMER in the last 72 hours. Hgb A1c No results for input(s): HGBA1C in the last 72 hours. Lipid Profile No  results for input(s): CHOL, HDL, LDLCALC, TRIG, CHOLHDL, LDLDIRECT in the last 72 hours. Thyroid function studies No results for input(s): TSH, T4TOTAL, T3FREE, THYROIDAB in the last 72 hours.  Invalid input(s): FREET3 Anemia work up No results for input(s): VITAMINB12, FOLATE, FERRITIN, TIBC, IRON, RETICCTPCT in the last 72 hours. Urinalysis    Component Value Date/Time   COLORURINE YELLOW 12/15/2019 1139   APPEARANCEUR CLOUDY (A) 12/15/2019 1139   LABSPEC 1.018 12/15/2019 1139   PHURINE 6.0 12/15/2019 1139   GLUCOSEU NEGATIVE 12/15/2019 1139   HGBUR NEGATIVE 12/15/2019 1139   BILIRUBINUR NEGATIVE 12/15/2019 1139   KETONESUR NEGATIVE 12/15/2019 1139   PROTEINUR NEGATIVE 12/15/2019 1139   NITRITE NEGATIVE 12/15/2019 1139   LEUKOCYTESUR NEGATIVE 12/15/2019 1139   Microbiology Recent Results (from the past 240 hour(s))  Culture, respiratory (non-expectorated)     Status: None (Preliminary result)   Collection Time: 12/26/19 11:37 AM   Specimen: Tracheal Aspirate; Respiratory  Result Value Ref Range Status   Specimen Description TRACHEAL ASPIRATE  Final   Special Requests NONE  Final   Gram Stain   Final    FEW WBC PRESENT,BOTH PMN AND MONONUCLEAR RARE GRAM NEGATIVE RODS Performed at [facility], [address] [place], [place] [address]    Culture FEW PSEUDOMONAS AERUGINOSA  Final   Report Status PENDING  Incomplete    Inpatient Medications:   Please see MAR  Radiological Exams on Admission: No results found.  Impression/Recommendations Active Problems:   Acute on chronic respiratory failure with hypoxia, vent dependent COVID-19 infection Pneumonia with Pseudomonas ARDS from COVID-19 infection Fever/leukocytosis Obesity/obstructive sleep apnea Pneumomediastinum Dysphagia/protein calorie malnutrition Unspecified lymphadenopathy Debility  Acute on chronic respiratory failure: Patient remains on the ventilator.  Likely multifactorial etiology.  She has  obesity with obstructive sleep apnea at baseline, now with worsening respiratory failure secondary to COVID-19 infection with concern for ARDS and secondary bacterial pneumonia.  Remains on the ventilator.  Her previous respiratory culture showed Pseudomonas for which she was treated with ceftazidime.  However, after that she started having fever, leukocytosis.  Repeat respiratory cultures again showing Pseudomonas.  She is also having increased secretions, continues to have rhonchi with coarse breath sounds. Will start her on meropenem.  Plan to treat for duration of 1 week pending improvement.  However, if she is not improving suggest repeat chest imaging preferably chest CT to better evaluate.  Pulmonary following.  COVID-19 infection: Patient was already treated with remdesivir, Decadron at the outside facility.  Here she was treated with hydroxyurea.  Continue supportive management per primary team.  Pneumonia: As mentioned above she had repeat respiratory cultures that showed Pseudomonas.  She completed treatment with ceftazidime previously.  However, now having fevers and leukocytosis again with increased secretions and coarse breath sounds.  Therefore we will start her on meropenem.  Plan as mentioned above.  Please monitor BUN/trending closely while on antibiotics.  Fever/leukocytosis: Antibiotics as mentioned above.  She continues to have worsening fevers would also recommend to send for blood cultures.  She previously had urine cultures which did not show any growth.  Due to with antibiotic treatment she  is at risk for C. difficile infection.  She does not have any abdominal pain or diarrhea.  However, if she starts having loose stools suggest to check stool for C. difficile.  Pneumomediastinum: This was at the outside facility.  Continue management per primary team.  Dysphagia/protein calorie malnutrition: Continue management per primary team.  Due to her dysphagia she is high risk for  aspiration and aspiration pneumonia.  Unspecified lymphadenopathy: Patient suggested to follow-up once her acute issues resolved.  Debility: She has generalized weakness with debility and deconditioning secondary to the prolonged illness.  Continue therapy and supportive management per the primary team.  Due to her complex medical she is high risk for worsening and decompensation.  Thank you for this consultation.  Plan of care discussed with the primary team and pharmacy.  Vonzella Nipple M.D. 12/28/2019, 5:02 PM

## 2019-12-28 NOTE — Progress Notes (Signed)
Pulmonary Critical Care Medicine [facility] [place]   PULMONARY CRITICAL CARE SERVICE  PROGRESS NOTE  Date of Service: 12/28/2019  Susan [name]  DQQ:[id number]  DOB: November 28, 1975   DOA: 12/14/2019  Referring Physician: Carron Curie, MD  HPI: Susan [name] is a 44 y.o. female seen for follow up of Acute on Chronic Respiratory Failure.  Patient currently is on pressure support has been on 35% FiO2 with a pressure of 12/5  Medications: Reviewed on Rounds  Physical Exam:  Vitals: Temperature is 99.8 pulse 126 respiratory rate 30 blood pressure is 126/86 saturations 97%  Ventilator Settings pressure support FiO2 35% pressure 12/5  . General: Comfortable at this time . Eyes: Grossly normal lids, irises & conjunctiva . ENT: grossly tongue is normal . Neck: no obvious mass . Cardiovascular: S1 S2 normal no gallop . Respiratory: Scattered rhonchi very coarse breath sounds . Abdomen: soft . Skin: no rash seen on limited exam . Musculoskeletal: not rigid . Psychiatric:unable to assess . Neurologic: no seizure no involuntary movements         Lab Data:   Basic Metabolic Panel: Recent Labs  Lab 12/22/19 0629 12/24/19 1133 12/26/19 0501  NA 138 140 140  K 3.8 3.7 4.6  CL 99 102 98  CO2 29 27 29   GLUCOSE 127* 144* 98  BUN 13 13 14   CREATININE 0.62 0.54 0.51  CALCIUM 9.8 9.9 9.9  MG 1.8 1.9 2.0  PHOS  --   --  5.0*    ABG: Recent Labs  Lab 12/24/19 1050 12/25/19 2045  PHART 7.462* 7.447  PCO2ART 41.2 44.4  PO2ART 86.0 75.1*  HCO3 29.2* 30.2*  O2SAT 96.9 94.8    Liver Function Tests: No results for input(s): AST, ALT, ALKPHOS, BILITOT, PROT, ALBUMIN in the last 168 hours. No results for input(s): LIPASE, AMYLASE in the last 168 hours. No results for input(s): AMMONIA in the last 168 hours.  CBC: Recent Labs  Lab 12/22/19 0629 12/24/19 1133 12/26/19 0501  WBC 11.3* 10.1 14.6*  HGB 11.7* 12.1 12.7  HCT 37.3 39.2 41.8  MCV 84.0 86.0 85.8   PLT 332 321 347    Cardiac Enzymes: No results for input(s): CKTOTAL, CKMB, CKMBINDEX, TROPONINI in the last 168 hours.  BNP (last 3 results) No results for input(s): BNP in the last 8760 hours.  ProBNP (last 3 results) No results for input(s): PROBNP in the last 8760 hours.  Radiological Exams: DG CHEST PORT 1 VIEW  Result Date: 12/26/2019 CLINICAL DATA:  Pneumonia. EXAM: PORTABLE CHEST 1 VIEW COMPARISON:  October 15, 2019. FINDINGS: The heart size and mediastinal contours are within normal limits. Tracheostomy tube is in grossly good position. No pneumothorax or pleural effusion is noted. Stable patchy airspace opacities are noted concerning for multifocal pneumonia. The visualized skeletal structures are unremarkable. IMPRESSION: Stable bilateral multifocal pneumonia. Electronically Signed   By: 12/28/2019 M.D.   On: 12/26/2019 12:55    Assessment/Plan Active Problems:   Acute on chronic respiratory failure with hypoxia (HCC)   COVID-19 virus infection   Pneumonia due to COVID-19 virus   Pneumomediastinum (HCC)   1. Acute on chronic respiratory failure hypoxia we will continue to try to wean as tolerated right now patient is on pressure support 12/5. 2. COVID-19 virus infection in recovery we will continue with supportive care. 3. Pneumonia due to COVID-19 has been treated we will continue present management 4. Pneumomediastinum resolved   I have personally seen and evaluated the patient,  evaluated laboratory and imaging results, formulated the assessment and plan and placed orders. The Patient requires high complexity decision making with multiple systems involvement.  Rounds were done with the Respiratory Therapy Director and Staff therapists and discussed with nursing staff also.  Allyne Gee, MD [facility] Pulmonary Critical Care Medicine Sleep Medicine

## 2019-12-29 DIAGNOSIS — J1282 Pneumonia due to coronavirus disease 2019: Secondary | ICD-10-CM | POA: Diagnosis not present

## 2019-12-29 DIAGNOSIS — U071 COVID-19: Secondary | ICD-10-CM | POA: Diagnosis not present

## 2019-12-29 DIAGNOSIS — J9621 Acute and chronic respiratory failure with hypoxia: Secondary | ICD-10-CM | POA: Diagnosis not present

## 2019-12-29 DIAGNOSIS — J982 Interstitial emphysema: Secondary | ICD-10-CM | POA: Diagnosis not present

## 2019-12-29 LAB — CBC
HCT: 40.8 % (ref 36.0–46.0)
Hemoglobin: 12.6 g/dL (ref 12.0–15.0)
MCH: 27.3 pg (ref 26.0–34.0)
MCHC: 30.9 g/dL (ref 30.0–36.0)
MCV: 88.5 fL (ref 80.0–100.0)
Platelets: 289 10*3/uL (ref 150–400)
RBC: 4.61 MIL/uL (ref 3.87–5.11)
RDW: 23.8 % — ABNORMAL HIGH (ref 11.5–15.5)
WBC: 8.6 10*3/uL (ref 4.0–10.5)
nRBC: 0 % (ref 0.0–0.2)

## 2019-12-29 LAB — BLOOD GAS, ARTERIAL
Acid-Base Excess: 7.2 mmol/L — ABNORMAL HIGH (ref 0.0–2.0)
Bicarbonate: 31.4 mmol/L — ABNORMAL HIGH (ref 20.0–28.0)
FIO2: 30
O2 Saturation: 96.9 %
Patient temperature: 35.7
pCO2 arterial: 43.4 mmHg (ref 32.0–48.0)
pH, Arterial: 7.465 — ABNORMAL HIGH (ref 7.350–7.450)
pO2, Arterial: 81.6 mmHg — ABNORMAL LOW (ref 83.0–108.0)

## 2019-12-29 LAB — BASIC METABOLIC PANEL
Anion gap: 11 (ref 5–15)
BUN: 17 mg/dL (ref 6–20)
CO2: 32 mmol/L (ref 22–32)
Calcium: 9.7 mg/dL (ref 8.9–10.3)
Chloride: 97 mmol/L — ABNORMAL LOW (ref 98–111)
Creatinine, Ser: 0.53 mg/dL (ref 0.44–1.00)
GFR, Estimated: >60 mL/min (ref >60)
Glucose, Bld: 124 mg/dL — ABNORMAL HIGH (ref 70–99)
Potassium: 4 mmol/L (ref 3.5–5.1)
Sodium: 140 mmol/L (ref 135–145)

## 2019-12-29 LAB — MAGNESIUM: Magnesium: 1.9 mg/dL (ref 1.7–2.4)

## 2019-12-29 LAB — PHOSPHORUS: Phosphorus: 5.4 mg/dL — ABNORMAL HIGH (ref 2.5–4.6)

## 2019-12-29 LAB — CULTURE, RESPIRATORY W GRAM STAIN

## 2019-12-29 NOTE — Progress Notes (Signed)
Pulmonary Critical Care Medicine [facility] [place]   PULMONARY CRITICAL CARE SERVICE  PROGRESS NOTE  Date of Service: 12/29/2019  Susan [name]  UJW:[id number]  DOB: [birth year]   DOA: 12/14/2019  Referring Physician: Carron Curie, MD  HPI: Susan [name] is a 44 y.o. female seen for follow up of Acute on Chronic Respiratory Failure.  Patient currently is on T collar has been on 30% FiO2 with good saturations.  Medications: Reviewed on Rounds  Physical Exam:  Vitals: Temperature is 95.2 pulse 113 respiratory rate is 27 blood pressure is 119/72 saturations 98%  Ventilator Settings on T collar with an FiO2 of 30%  . General: Comfortable at this time . Eyes: Grossly normal lids, irises & conjunctiva . ENT: grossly tongue is normal . Neck: no obvious mass . Cardiovascular: S1 S2 normal no gallop . Respiratory: No rhonchi no rales are noted at this time . Abdomen: soft . Skin: no rash seen on limited exam . Musculoskeletal: not rigid . Psychiatric:unable to assess . Neurologic: no seizure no involuntary movements         Lab Data:   Basic Metabolic Panel: Recent Labs  Lab 12/24/19 1133 12/26/19 0501 12/29/19 0828  NA 140 140 140  K 3.7 4.6 4.0  CL 102 98 97*  CO2 27 29 32  GLUCOSE 144* 98 124*  BUN 13 14 17   CREATININE 0.54 0.51 0.53  CALCIUM 9.9 9.9 9.7  MG 1.9 2.0 1.9  PHOS  --  5.0* 5.4*    ABG: Recent Labs  Lab 12/24/19 1050 12/25/19 2045 12/29/19 1043  PHART 7.462* 7.447 7.465*  PCO2ART 41.2 44.4 43.4  PO2ART 86.0 75.1* 81.6*  HCO3 29.2* 30.2* 31.4*  O2SAT 96.9 94.8 96.9    Liver Function Tests: No results for input(s): AST, ALT, ALKPHOS, BILITOT, PROT, ALBUMIN in the last 168 hours. No results for input(s): LIPASE, AMYLASE in the last 168 hours. No results for input(s): AMMONIA in the last 168 hours.  CBC: Recent Labs  Lab 12/24/19 1133 12/26/19 0501 12/29/19 0828  WBC 10.1 14.6* 8.6  HGB 12.1 12.7 12.6  HCT 39.2 41.8  40.8  MCV 86.0 85.8 88.5  PLT 321 347 289    Cardiac Enzymes: No results for input(s): CKTOTAL, CKMB, CKMBINDEX, TROPONINI in the last 168 hours.  BNP (last 3 results) No results for input(s): BNP in the last 8760 hours.  ProBNP (last 3 results) No results for input(s): PROBNP in the last 8760 hours.  Radiological Exams: No results found.  Assessment/Plan Active Problems:   Acute on chronic respiratory failure with hypoxia (HCC)   COVID-19 virus infection   Pneumonia due to COVID-19 virus   Pneumomediastinum (HCC)   1. Acute on chronic respiratory failure hypoxia we will continue T collar trials titrate oxygen continue pulmonary toilet 2. COVID-19 virus infection in recovery 3. Pneumonia due to COVID-19 has been treated we will continue to follow 4. Pneumomediastinum no change   I have personally seen and evaluated the patient, evaluated laboratory and imaging results, formulated the assessment and plan and placed orders. The Patient requires high complexity decision making with multiple systems involvement.  Rounds were done with the Respiratory Therapy Director and Staff therapists and discussed with nursing staff also.  12/31/19, MD [facility] Pulmonary Critical Care Medicine Sleep Medicine

## 2019-12-30 DIAGNOSIS — J9621 Acute and chronic respiratory failure with hypoxia: Secondary | ICD-10-CM | POA: Diagnosis not present

## 2019-12-30 DIAGNOSIS — U071 COVID-19: Secondary | ICD-10-CM | POA: Diagnosis not present

## 2019-12-30 DIAGNOSIS — J982 Interstitial emphysema: Secondary | ICD-10-CM | POA: Diagnosis not present

## 2019-12-30 DIAGNOSIS — J1282 Pneumonia due to coronavirus disease 2019: Secondary | ICD-10-CM | POA: Diagnosis not present

## 2019-12-30 NOTE — Progress Notes (Signed)
Pulmonary Critical Care Medicine [facility] [place]   PULMONARY CRITICAL CARE SERVICE  PROGRESS NOTE  Date of Service: 12/30/2019  Susan [name]  PJK:[id number]  DOB: [birth year]   DOA: 12/14/2019  Referring Physician: Carron Curie, MD  HPI: Susan [name] is a 44 y.o. female seen for follow up of Acute on Chronic Respiratory Failure.  Patient currently is on T collar has been on 30% FiO2 completing 48 hours  Medications: Reviewed on Rounds  Physical Exam:  Vitals: Temperature is 95.5 pulse 100 respiratory 21 blood pressure is 146/86 saturations 97%  Ventilator Settings off the ventilator on T collar  . General: Comfortable at this time . Eyes: Grossly normal lids, irises & conjunctiva . ENT: grossly tongue is normal . Neck: no obvious mass . Cardiovascular: S1 S2 normal no gallop . Respiratory: Scattered rhonchi coarse breath sounds . Abdomen: soft . Skin: no rash seen on limited exam . Musculoskeletal: not rigid . Psychiatric:unable to assess . Neurologic: no seizure no involuntary movements         Lab Data:   Basic Metabolic Panel: Recent Labs  Lab 12/24/19 1133 12/26/19 0501 12/29/19 0828  NA 140 140 140  K 3.7 4.6 4.0  CL 102 98 97*  CO2 27 29 32  GLUCOSE 144* 98 124*  BUN 13 14 17   CREATININE 0.54 0.51 0.53  CALCIUM 9.9 9.9 9.7  MG 1.9 2.0 1.9  PHOS  --  5.0* 5.4*    ABG: Recent Labs  Lab 12/24/19 1050 12/25/19 2045 12/29/19 1043  PHART 7.462* 7.447 7.465*  PCO2ART 41.2 44.4 43.4  PO2ART 86.0 75.1* 81.6*  HCO3 29.2* 30.2* 31.4*  O2SAT 96.9 94.8 96.9    Liver Function Tests: No results for input(s): AST, ALT, ALKPHOS, BILITOT, PROT, ALBUMIN in the last 168 hours. No results for input(s): LIPASE, AMYLASE in the last 168 hours. No results for input(s): AMMONIA in the last 168 hours.  CBC: Recent Labs  Lab 12/24/19 1133 12/26/19 0501 12/29/19 0828  WBC 10.1 14.6* 8.6  HGB 12.1 12.7 12.6  HCT 39.2 41.8 40.8  MCV 86.0  85.8 88.5  PLT 321 347 289    Cardiac Enzymes: No results for input(s): CKTOTAL, CKMB, CKMBINDEX, TROPONINI in the last 168 hours.  BNP (last 3 results) No results for input(s): BNP in the last 8760 hours.  ProBNP (last 3 results) No results for input(s): PROBNP in the last 8760 hours.  Radiological Exams: No results found.  Assessment/Plan Active Problems:   Acute on chronic respiratory failure with hypoxia (HCC)   COVID-19 virus infection   Pneumonia due to COVID-19 virus   Pneumomediastinum (HCC)   1. Acute on chronic respiratory failure with hypoxia currently is on T collar has been on 30% FiO2 goal today is 48 hours. 2. COVID-19 virus infection in recovery we will continue to follow 3. Pneumonia due to COVID-19 treated 4. Pneumomediastinum slow improvement   I have personally seen and evaluated the patient, evaluated laboratory and imaging results, formulated the assessment and plan and placed orders. The Patient requires high complexity decision making with multiple systems involvement.  Rounds were done with the Respiratory Therapy Director and Staff therapists and discussed with nursing staff also.  12/31/19, MD [facility] Pulmonary Critical Care Medicine Sleep Medicine

## 2019-12-30 NOTE — Consult Note (Signed)
Ref: Calvert Cantor, NP   Subjective:  Awake. HR in 110's. Sinus tachycardia.  Objective:  Vital Signs in the last 24 hours: BP: 146/86 P: 113 R: 29 O2 sat: 97 % on T collar, 30 % FiO2.  Physical Exam: BP Readings from Last 1 Encounters:  No data found for BP     Wt Readings from Last 1 Encounters:  No data found for Wt    Weight change:  There is no height or weight on file to calculate BMI. HEENT: [place]/AT, Eyes-Brown, PERL, EOMI, Conjunctiva-Pink, Sclera-Non-icteric Neck: No JVD, No bruit, Trachea midline. Lungs:  Clear, Bilateral. Cardiac:  Regular rhythm, normal S1 and S2, no S3. II/VI systolic murmur. Abdomen:  Soft, non-tender. BS present. Extremities:  No edema present. No cyanosis. No clubbing. CNS: AxOx3, Cranial nerves grossly intact, moves all 4 extremities.  Skin: Warm and dry.   Intake/Output from previous day: No intake/output data recorded.    Lab Results: BMET    Component Value Date/Time   NA 140 12/29/2019 0828   NA 140 12/26/2019 0501   NA 140 12/24/2019 1133   K 4.0 12/29/2019 0828   K 4.6 12/26/2019 0501   K 3.7 12/24/2019 1133   CL 97 (L) 12/29/2019 0828   CL 98 12/26/2019 0501   CL 102 12/24/2019 1133   CO2 32 12/29/2019 0828   CO2 29 12/26/2019 0501   CO2 27 12/24/2019 1133   GLUCOSE 124 (H) 12/29/2019 0828   GLUCOSE 98 12/26/2019 0501   GLUCOSE 144 (H) 12/24/2019 1133   BUN 17 12/29/2019 0828   BUN 14 12/26/2019 0501   BUN 13 12/24/2019 1133   CREATININE 0.53 12/29/2019 0828   CREATININE 0.51 12/26/2019 0501   CREATININE 0.54 12/24/2019 1133   CALCIUM 9.7 12/29/2019 0828   CALCIUM 9.9 12/26/2019 0501   CALCIUM 9.9 12/24/2019 1133   GFRNONAA >60 12/29/2019 0828   GFRNONAA >60 12/26/2019 0501   GFRNONAA >60 12/24/2019 1133   CBC    Component Value Date/Time   WBC 8.6 12/29/2019 0828   RBC 4.61 12/29/2019 0828   HGB 12.6 12/29/2019 0828   HCT 40.8 12/29/2019 0828   PLT 289 12/29/2019 0828   MCV 88.5 12/29/2019 0828    MCH 27.3 12/29/2019 0828   MCHC 30.9 12/29/2019 0828   RDW 23.8 (H) 12/29/2019 0828   HEPATIC Function Panel Recent Labs    12/15/19 0447  PROT 6.6   HEMOGLOBIN A1C No components found for: HGA1C,  MPG CARDIAC ENZYMES No results found for: CKTOTAL, CKMB, CKMBINDEX, TROPONINI BNP No results for input(s): PROBNP in the last 8760 hours. TSH Recent Labs    12/16/19 0411  TSH 1.583   CHOLESTEROL No results for input(s): CHOL in the last 8760 hours.  Scheduled Meds: Continuous Infusions: PRN Meds:.iopamidol  Assessment/Plan: Acute on chronic respiratory failure with hypoxia S/P COVID 19 infection and pneumonia Pneumomediastinum S/P tracheostomy  Increase activity as tolerated.   LOS: 0 days   Time spent including chart review, lab review, examination, discussion with patient/Nurse : 30 min   Orpah Cobb  MD  12/30/2019, 1:24 PM

## 2019-12-31 ENCOUNTER — Other Ambulatory Visit (HOSPITAL_COMMUNITY): Payer: BC Managed Care – PPO

## 2019-12-31 DIAGNOSIS — J1282 Pneumonia due to coronavirus disease 2019: Secondary | ICD-10-CM | POA: Diagnosis not present

## 2019-12-31 DIAGNOSIS — J982 Interstitial emphysema: Secondary | ICD-10-CM | POA: Diagnosis not present

## 2019-12-31 DIAGNOSIS — J9621 Acute and chronic respiratory failure with hypoxia: Secondary | ICD-10-CM | POA: Diagnosis not present

## 2019-12-31 DIAGNOSIS — U071 COVID-19: Secondary | ICD-10-CM | POA: Diagnosis not present

## 2019-12-31 NOTE — Progress Notes (Signed)
Pulmonary Critical Care Medicine [facility] [place]   PULMONARY CRITICAL CARE SERVICE  PROGRESS NOTE  Date of Service: 12/31/2019  Imagine Nest  PJA:[id number]  DOB: 1975-03-15   DOA: 12/14/2019  Referring Physician: Carron Curie, MD  HPI: Susan [name] is a 44 y.o. female seen for follow up of Acute on Chronic Respiratory Failure.  Patient currently is on T collar has been on 28% FiO2 with good saturations.  Medications: Reviewed on Rounds  Physical Exam:  Vitals: Temperature 96.6 pulse 101 respiratory rate is 27 blood pressure is 128/73 saturations 98%  Ventilator Settings on T collar FiO2 20%  . General: Comfortable at this time . Eyes: Grossly normal lids, irises & conjunctiva . ENT: grossly tongue is normal . Neck: no obvious mass . Cardiovascular: S1 S2 normal no gallop . Respiratory: No rhonchi no rales are noted at this time . Abdomen: soft . Skin: no rash seen on limited exam . Musculoskeletal: not rigid . Psychiatric:unable to assess . Neurologic: no seizure no involuntary movements         Lab Data:   Basic Metabolic Panel: Recent Labs  Lab 12/24/19 1133 12/26/19 0501 12/29/19 0828  NA 140 140 140  K 3.7 4.6 4.0  CL 102 98 97*  CO2 27 29 32  GLUCOSE 144* 98 124*  BUN 13 14 17   CREATININE 0.54 0.51 0.53  CALCIUM 9.9 9.9 9.7  MG 1.9 2.0 1.9  PHOS  --  5.0* 5.4*    ABG: Recent Labs  Lab 12/24/19 1050 12/25/19 2045 12/29/19 1043  PHART 7.462* 7.447 7.465*  PCO2ART 41.2 44.4 43.4  PO2ART 86.0 75.1* 81.6*  HCO3 29.2* 30.2* 31.4*  O2SAT 96.9 94.8 96.9    Liver Function Tests: No results for input(s): AST, ALT, ALKPHOS, BILITOT, PROT, ALBUMIN in the last 168 hours. No results for input(s): LIPASE, AMYLASE in the last 168 hours. No results for input(s): AMMONIA in the last 168 hours.  CBC: Recent Labs  Lab 12/24/19 1133 12/26/19 0501 12/29/19 0828  WBC 10.1 14.6* 8.6  HGB 12.1 12.7 12.6  HCT 39.2 41.8 40.8  MCV 86.0  85.8 88.5  PLT 321 347 289    Cardiac Enzymes: No results for input(s): CKTOTAL, CKMB, CKMBINDEX, TROPONINI in the last 168 hours.  BNP (last 3 results) No results for input(s): BNP in the last 8760 hours.  ProBNP (last 3 results) No results for input(s): PROBNP in the last 8760 hours.  Radiological Exams: DG CHEST PORT 1 VIEW  Result Date: 12/31/2019 CLINICAL DATA:  13/03/2019 present Janina Mayo failure EXAM: PORTABLE CHEST 1 VIEW COMPARISON:  12/26/19 FINDINGS: Tracheostomy tube in place. Low volume chest with chronic interstitial coarsening, stable. No visible effusion or pneumothorax. Normal heart size. Percutaneous gastrostomy tube and cholecystectomy clips. IMPRESSION: Stable low volume chest with interstitial opacity. Electronically Signed   By: 12/28/19 M.D.   On: 12/31/2019 06:41    Assessment/Plan Active Problems:   Acute on chronic respiratory failure with hypoxia (HCC)   COVID-19 virus infection   Pneumonia due to COVID-19 virus   Pneumomediastinum (HCC)   1. Acute on chronic respiratory failure with hypoxia we will continue with the weaning process today will be 48 hours.  Go ahead and change tracheostomy to a #6 cuffless trach 2. COVID-19 virus infection in recovery we will continue to follow along 3. Pneumonia due to COVID-19 treated slow improvement 4. Pneumomediastinum also showing some improvement   I have personally seen and evaluated the patient, evaluated  laboratory and imaging results, formulated the assessment and plan and placed orders. The Patient requires high complexity decision making with multiple systems involvement.  Rounds were done with the Respiratory Therapy Director and Staff therapists and discussed with nursing staff also.  Allyne Gee, MD [facility] Pulmonary Critical Care Medicine Sleep Medicine

## 2020-01-01 ENCOUNTER — Other Ambulatory Visit (HOSPITAL_COMMUNITY): Payer: BC Managed Care – PPO

## 2020-01-01 DIAGNOSIS — J1282 Pneumonia due to coronavirus disease 2019: Secondary | ICD-10-CM | POA: Diagnosis not present

## 2020-01-01 DIAGNOSIS — U071 COVID-19: Secondary | ICD-10-CM | POA: Diagnosis not present

## 2020-01-01 DIAGNOSIS — J9621 Acute and chronic respiratory failure with hypoxia: Secondary | ICD-10-CM | POA: Diagnosis not present

## 2020-01-01 DIAGNOSIS — J982 Interstitial emphysema: Secondary | ICD-10-CM | POA: Diagnosis not present

## 2020-01-01 NOTE — Progress Notes (Signed)
Pulmonary Critical Care Medicine [facility] [place]   PULMONARY CRITICAL CARE SERVICE  PROGRESS NOTE  Date of Service: 01/01/2020  Susan [name]  WLS:[id number]  DOB: 03-28-1975   DOA: 12/14/2019  Referring Physician: Carron Curie, MD  HPI: Susan [name] is a 44 y.o. female seen for follow up of Acute on Chronic Respiratory Failure.  Patient is capping on 2 L doing well today will be 24 hours  Medications: Reviewed on Rounds  Physical Exam:  Vitals: Temperature 96.8 pulse 100 respiratory 22 blood pressure is 121/78 saturations 98%  Ventilator Settings capping off the ventilator on 2 L oxygen  . General: Comfortable at this time . Eyes: Grossly normal lids, irises & conjunctiva . ENT: grossly tongue is normal . Neck: no obvious mass . Cardiovascular: S1 S2 normal no gallop . Respiratory: No rhonchi very coarse breath sounds . Abdomen: soft . Skin: no rash seen on limited exam . Musculoskeletal: not rigid . Psychiatric:unable to assess . Neurologic: no seizure no involuntary movements         Lab Data:   Basic Metabolic Panel: Recent Labs  Lab 12/26/19 0501 12/29/19 0828  NA 140 140  K 4.6 4.0  CL 98 97*  CO2 29 32  GLUCOSE 98 124*  BUN 14 17  CREATININE 0.51 0.53  CALCIUM 9.9 9.7  MG 2.0 1.9  PHOS 5.0* 5.4*    ABG: Recent Labs  Lab 12/29/19 1043  PHART 7.465*  PCO2ART 43.4  PO2ART 81.6*  HCO3 31.4*  O2SAT 96.9    Liver Function Tests: No results for input(s): AST, ALT, ALKPHOS, BILITOT, PROT, ALBUMIN in the last 168 hours. No results for input(s): LIPASE, AMYLASE in the last 168 hours. No results for input(s): AMMONIA in the last 168 hours.  CBC: Recent Labs  Lab 12/26/19 0501 12/29/19 0828  WBC 14.6* 8.6  HGB 12.7 12.6  HCT 41.8 40.8  MCV 85.8 88.5  PLT 347 289    Cardiac Enzymes: No results for input(s): CKTOTAL, CKMB, CKMBINDEX, TROPONINI in the last 168 hours.  BNP (last 3 results) No results for input(s): BNP  in the last 8760 hours.  ProBNP (last 3 results) No results for input(s): PROBNP in the last 8760 hours.  Radiological Exams: DG CHEST PORT 1 VIEW  Result Date: 12/31/2019 CLINICAL DATA:  Janina Mayo present Ramond Marrow failure EXAM: PORTABLE CHEST 1 VIEW COMPARISON:  12/26/19 FINDINGS: Tracheostomy tube in place. Low volume chest with chronic interstitial coarsening, stable. No visible effusion or pneumothorax. Normal heart size. Percutaneous gastrostomy tube and cholecystectomy clips. IMPRESSION: Stable low volume chest with interstitial opacity. Electronically Signed   By: Marnee Spring M.D.   On: 12/31/2019 06:41    Assessment/Plan Active Problems:   Acute on chronic respiratory failure with hypoxia (HCC)   COVID-19 virus infection   Pneumonia due to COVID-19 virus   Pneumomediastinum (HCC)   1. Acute on chronic respiratory failure hypoxia we will continue with capping trials today will be 24 hours 2. COVID-19 virus infection in recovery we will continue to follow along 3. Pneumomediastinum resolved we will continue with supportive care 4. Pneumonia due to COVID-19 treated slow improvement   I have personally seen and evaluated the patient, evaluated laboratory and imaging results, formulated the assessment and plan and placed orders. The Patient requires high complexity decision making with multiple systems involvement.  Rounds were done with the Respiratory Therapy Director and Staff therapists and discussed with nursing staff also.  Yevonne Pax, MD [facility] Pulmonary  Critical Care Medicine Sleep Medicine

## 2020-01-02 LAB — BASIC METABOLIC PANEL
Anion gap: 10 (ref 5–15)
BUN: 12 mg/dL (ref 6–20)
CO2: 31 mmol/L (ref 22–32)
Calcium: 9.4 mg/dL (ref 8.9–10.3)
Chloride: 100 mmol/L (ref 98–111)
Creatinine, Ser: 0.5 mg/dL (ref 0.44–1.00)
GFR, Estimated: >60 mL/min (ref >60)
Glucose, Bld: 131 mg/dL — ABNORMAL HIGH (ref 70–99)
Potassium: 3.8 mmol/L (ref 3.5–5.1)
Sodium: 141 mmol/L (ref 135–145)

## 2020-01-02 LAB — CBC
HCT: 36.4 % (ref 36.0–46.0)
Hemoglobin: 11.3 g/dL — ABNORMAL LOW (ref 12.0–15.0)
MCH: 27.9 pg (ref 26.0–34.0)
MCHC: 31 g/dL (ref 30.0–36.0)
MCV: 89.9 fL (ref 80.0–100.0)
Platelets: 251 10*3/uL (ref 150–400)
RBC: 4.05 MIL/uL (ref 3.87–5.11)
RDW: 23.1 % — ABNORMAL HIGH (ref 11.5–15.5)
WBC: 6.8 10*3/uL (ref 4.0–10.5)
nRBC: 0 % (ref 0.0–0.2)

## 2020-01-02 LAB — MAGNESIUM: Magnesium: 1.8 mg/dL (ref 1.7–2.4)

## 2020-01-03 DIAGNOSIS — J1282 Pneumonia due to coronavirus disease 2019: Secondary | ICD-10-CM | POA: Diagnosis not present

## 2020-01-03 DIAGNOSIS — J982 Interstitial emphysema: Secondary | ICD-10-CM | POA: Diagnosis not present

## 2020-01-03 DIAGNOSIS — U071 COVID-19: Secondary | ICD-10-CM | POA: Diagnosis not present

## 2020-01-03 DIAGNOSIS — J9621 Acute and chronic respiratory failure with hypoxia: Secondary | ICD-10-CM | POA: Diagnosis not present

## 2020-01-03 NOTE — Progress Notes (Signed)
Pulmonary Critical Care Medicine [facility] [place]   PULMONARY CRITICAL CARE SERVICE  PROGRESS NOTE  Date of Service: 01/03/2020  Susan [name]  VEL:[id number]  DOB: Jan 09, 1976   DOA: 12/14/2019  Referring Physician: Carron Curie, MD  HPI: Susan [name] is a 44 y.o. female seen for follow up of Acute on Chronic Respiratory Failure.  She is capping doing well has been on 2 L ready for decannulation.  Medications: Reviewed on Rounds  Physical Exam:  Vitals: Temperature is 98.2 pulse 99 respiratory rate 20 blood pressure is 122/74 saturations 99%  Ventilator Settings capping on 2 L   General: Comfortable at this time  Eyes: Grossly normal lids, irises & conjunctiva  ENT: grossly tongue is normal  Neck: no obvious mass  Cardiovascular: S1 S2 normal no gallop  Respiratory: No rhonchi no rales are noted at this time  Abdomen: soft  Skin: no rash seen on limited exam  Musculoskeletal: not rigid  Psychiatric:unable to assess  Neurologic: no seizure no involuntary movements         Lab Data:   Basic Metabolic Panel: Recent Labs  Lab 12/29/19 0828 01/02/20 0348  NA 140 141  K 4.0 3.8  CL 97* 100  CO2 32 31  GLUCOSE 124* 131*  BUN 17 12  CREATININE 0.53 0.50  CALCIUM 9.7 9.4  MG 1.9 1.8  PHOS 5.4*  --     ABG: Recent Labs  Lab 12/29/19 1043  PHART 7.465*  PCO2ART 43.4  PO2ART 81.6*  HCO3 31.4*  O2SAT 96.9    Liver Function Tests: No results for input(s): AST, ALT, ALKPHOS, BILITOT, PROT, ALBUMIN in the last 168 hours. No results for input(s): LIPASE, AMYLASE in the last 168 hours. No results for input(s): AMMONIA in the last 168 hours.  CBC: Recent Labs  Lab 12/29/19 0828 01/02/20 0348  WBC 8.6 6.8  HGB 12.6 11.3*  HCT 40.8 36.4  MCV 88.5 89.9  PLT 289 251    Cardiac Enzymes: No results for input(s): CKTOTAL, CKMB, CKMBINDEX, TROPONINI in the last 168 hours.  BNP (last 3 results) No results for input(s): BNP in  the last 8760 hours.  ProBNP (last 3 results) No results for input(s): PROBNP in the last 8760 hours.  Radiological Exams: No results found.  Assessment/Plan Active Problems:   Acute on chronic respiratory failure with hypoxia (HCC)   COVID-19 virus infection   Pneumonia due to COVID-19 virus   Pneumomediastinum (HCC)   1. Acute on chronic respiratory failure hypoxia we will proceed to decannulation 2. COVID-19 virus infection coverage we will continue with supportive care 3. Pneumonia due to COVID-19 treated we will continue present management 4. Pneumomediastinum no change continue with supportive care   I have personally seen and evaluated the patient, evaluated laboratory and imaging results, formulated the assessment and plan and placed orders. The Patient requires high complexity decision making with multiple systems involvement.  Rounds were done with the Respiratory Therapy Director and Staff therapists and discussed with nursing staff also.  Yevonne Pax, MD [facility] Pulmonary Critical Care Medicine Sleep Medicine

## 2020-01-22 DIAGNOSIS — R0689 Other abnormalities of breathing: Secondary | ICD-10-CM | POA: Insufficient documentation

## 2021-01-12 DIAGNOSIS — M898X7 Other specified disorders of bone, ankle and foot: Secondary | ICD-10-CM | POA: Insufficient documentation

## 2022-03-12 IMAGING — DX DG ABDOMEN 1V
1 series · 1 of 1 positions shown · non-contrast
Comparison: December 15, 2019

CLINICAL DATA: Peg tube placement evaluation.

EXAM:
ABDOMEN - 1 VIEW

[abdomen]
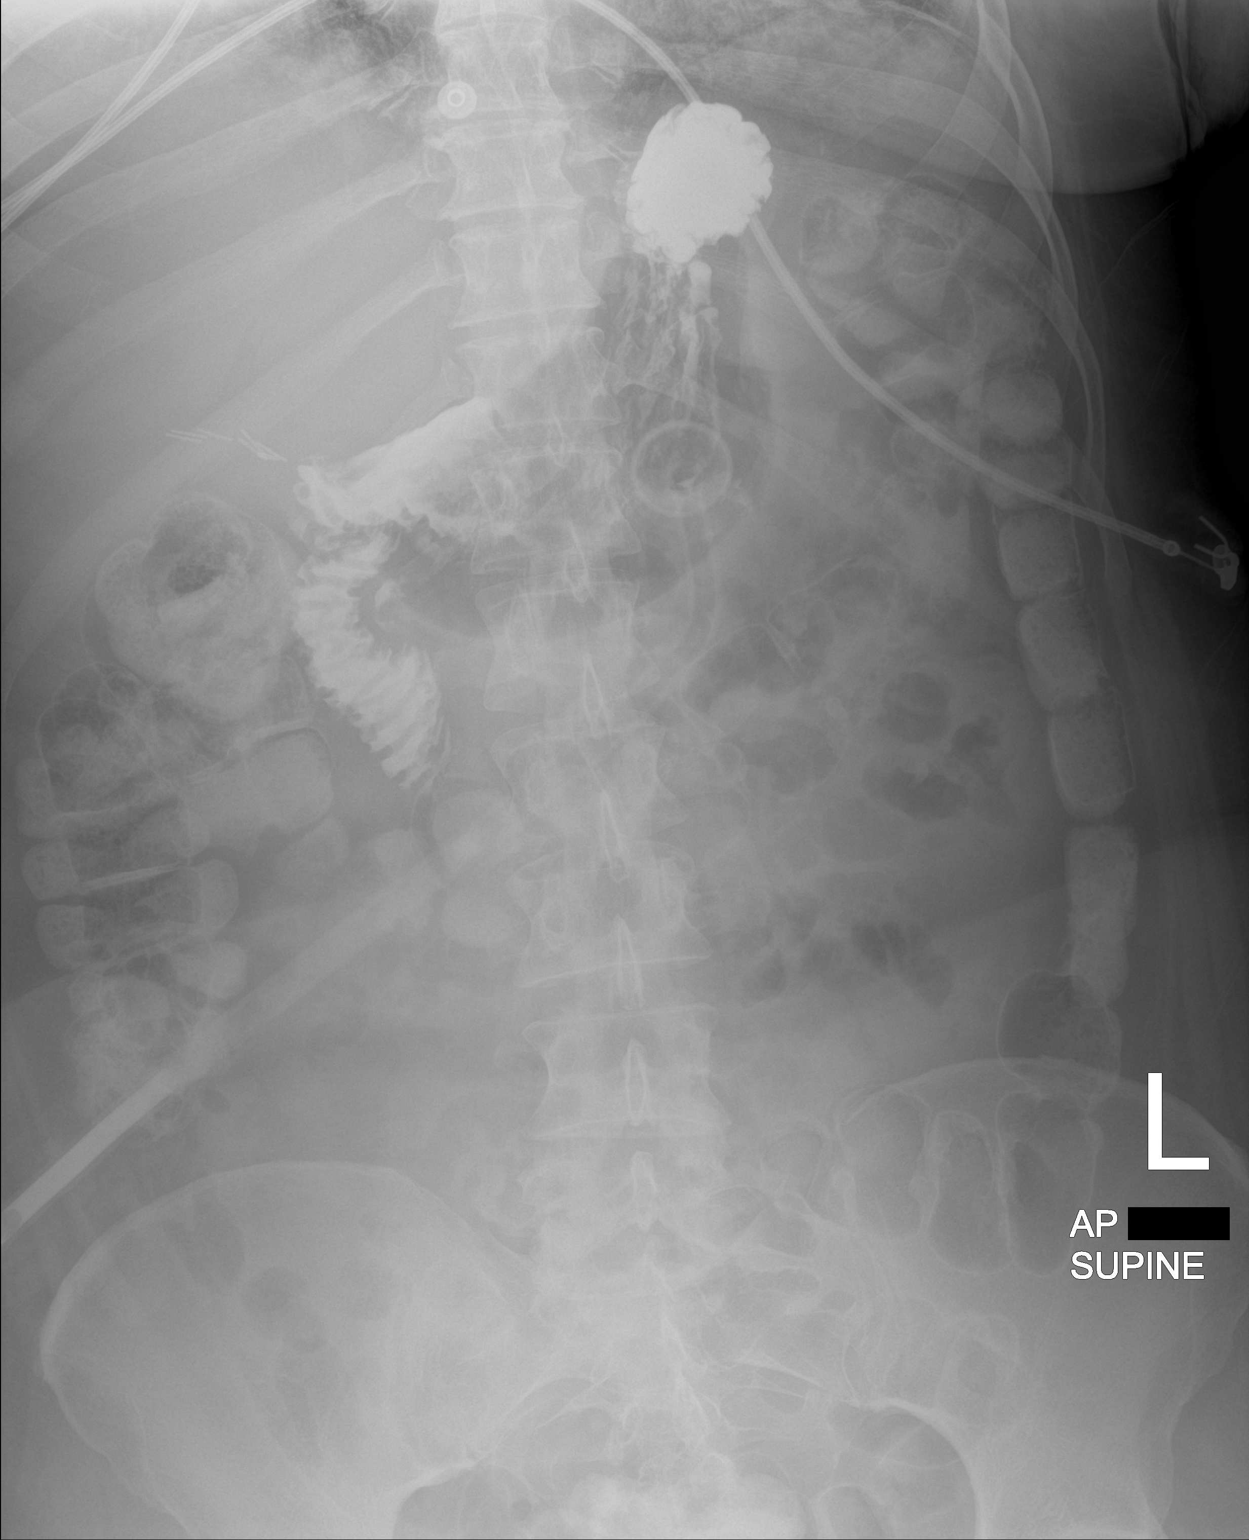

[1 of 1 positions shown; findings below may reference images not displayed]

FINDINGS: The study was performed after injecting the PEG tube with 50 mL of
Omnipaque. There is contrast in the stomach and proximal duodenum.
No evidence of leak. There is a small amount of contrast in the
colon, likely from yesterday's study. No other acute abnormalities.
IMPRESSION: The PEG tube appears to be in good position as above. No evidence of
leak on this study.

## 2022-07-14 ENCOUNTER — Ambulatory Visit: Payer: Managed Care, Other (non HMO) | Admitting: Podiatry

## 2022-07-14 ENCOUNTER — Ambulatory Visit: Payer: Managed Care, Other (non HMO)

## 2022-07-14 DIAGNOSIS — M7661 Achilles tendinitis, right leg: Secondary | ICD-10-CM

## 2022-07-14 DIAGNOSIS — M9261 Juvenile osteochondrosis of tarsus, right ankle: Secondary | ICD-10-CM | POA: Diagnosis not present

## 2022-07-14 DIAGNOSIS — M7731 Calcaneal spur, right foot: Secondary | ICD-10-CM

## 2022-07-14 NOTE — Progress Notes (Signed)
Chief Complaint  Patient presents with   Foot Pain    Pain to right foot achilles tendon for a couple of years. Patient was previously seeing a podiatrist in [facility] whom recommend surgery. Patient wanting a second opinion.     HPI: 47 y.o. female presenting today with c/o pain in the back of the right heel.  She has been seeing another podiatrist for this over the past 2 years with little improvement.  She has had cortisone injections, two 6-week periods of immobilization, NSAIDs, physical therapy, shoe changes and modifications, heel lifts, and none have given her complete relief.  She notes she suffered COVID at the end of 2021 and was placed on a ventilator for 40 days.  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   She is coming for 2nd opinion today, as her previous podiatrist told her it's now time to proceed with surgery.  She stated she has never seen her foot x-rays before and has never had an MRI for this condition.  Past Medical History:  Diagnosis Date   Acute on chronic respiratory failure with hypoxia (HCC)    COVID-19 virus infection    Pneumomediastinum (HCC)    Pneumonia due to COVID-19 virus     Allergies  Allergen Reactions   Other Hives    strawberries   Raspberry Hives   Strawberry Flavor Hives     Physical Exam: General: The patient is alert and oriented x3 in no acute distress.  Dermatology:  No ecchymosis, no erythema bilateral.  No open lesions.    Vascular: Palpable pedal pulses bilaterally. Capillary refill within normal limits.  No appreciable edema.    Neurological: Light touch sensation intact bilateral.  MMT 5/5 to lower extremity bilateral. Negative Tinel's sign with percussion of the posterior tibial nerve on the affected extremity.    Musculoskeletal Exam:  There is pain on palpation of the posterior aspect of the right  heel.  There is a palpable enlargement, or thickness, to the distal Achilles tendon just superior to the insertion into the calcaneus.  There is pain on palpation starting at this area and extending inferiorly to the posterior calcaneal tubercle.  Antalgic gait noted with first steps out of exam chair.  Minimal pain on palpation of the plantar heel.  No ecchymosis to posterior heel.  There is minimal localized edema to the posterior heel near the Achilles tendon insertion.  Radiographic Exam (right foot x-rays obtained 07/14/2022): There is an enlarged posterior superior tubercle on the calcaneal body.  This is consistent with Haglund's deformity.  There is no fracture of the calcaneus.  There is a posterior calcaneal spur with some adjacent calcifications within the Achilles tendon near the insertion.  When visualizing the Achilles tendon shadow on x-ray there is significant thickening of the Achilles tendon just superior to the insertion point of the calcaneus.   Assessment/Plan of Care: 1. Achilles tendinitis of right lower extremity   2. Haglund's deformity of right heel   3. Calcaneal spur, right     -Reviewed etiology of achilles tendonitis with patient.  Discussed treatment options with patient today, including cortisone injection, NSAID course of treatment, stretching exercises, use of night splint, physical therapy, rest, icing the heel, arch supports/orthotics, and supportive shoe gear.  He has already tried all of these conservative therapies which have been unsuccessful.  I informed the patient today that I feel that it is time to go ahead and proceed with surgical planning.  The intent was to refer her back to her original podiatrist since we were a second opinion, however the patient would like to proceed with surgical intervention through our office.  Informed the patient that we will first need to get an MRI to evaluate the tendon health and look for any specific pathology that would need  addressed separately from the Haglund's deformity intraoperatively.  She understood.  She was informed that the Haglund's bump would need to be resected as well as repair of the Achilles tendon.  At this time could not definitively tell the patient whether the Achilles tendon would need to be completely detached and reattached, or if small windows could be made within the tendon to address pathology and repair appropriately.  The MRI will help to evaluate for that so that we can also help her plan for the expected postoperative course.  Order written for MRI right ankle to evaluate the Achilles follow-up after MRI to review results and also to begin surgical planning   Return for will call to set up patient for appt after MRI to review results and plan for surgery.   Clerance Lav, DPM, FACFAS [facility]     [address] [place], [place] [address]                Office [phone]  Fax [phone]

## 2022-07-24 ENCOUNTER — Other Ambulatory Visit: Payer: BC Managed Care – PPO

## 2022-07-24 ENCOUNTER — Ambulatory Visit
Admission: RE | Admit: 2022-07-24 | Discharge: 2022-07-24 | Disposition: A | Discharge status: Home / Self Care | Payer: Managed Care, Other (non HMO) | Source: Ambulatory Visit | Attending: Podiatry | Admitting: Podiatry

## 2022-07-24 DIAGNOSIS — M7661 Achilles tendinitis, right leg: Secondary | ICD-10-CM

## 2022-07-24 DIAGNOSIS — M7731 Calcaneal spur, right foot: Secondary | ICD-10-CM

## 2022-07-24 DIAGNOSIS — M9261 Juvenile osteochondrosis of tarsus, right ankle: Secondary | ICD-10-CM

## 2022-07-30 ENCOUNTER — Telehealth: Payer: Self-pay | Admitting: Podiatry

## 2022-07-30 NOTE — Telephone Encounter (Signed)
Left message for pt to call to schedule an appt to go over mri results. Pt is an Copywriter, advertising pt

## 2022-08-11 ENCOUNTER — Ambulatory Visit: Payer: Managed Care, Other (non HMO) | Admitting: Podiatry

## 2022-08-11 ENCOUNTER — Encounter: Payer: Self-pay | Admitting: Podiatry

## 2022-08-11 DIAGNOSIS — M7661 Achilles tendinitis, right leg: Secondary | ICD-10-CM | POA: Diagnosis not present

## 2022-08-11 DIAGNOSIS — M9261 Juvenile osteochondrosis of tarsus, right ankle: Secondary | ICD-10-CM

## 2022-08-11 DIAGNOSIS — M7731 Calcaneal spur, right foot: Secondary | ICD-10-CM

## 2022-08-11 NOTE — Progress Notes (Signed)
Chief Complaint  Patient presents with   Results Review    Rm 1 MRI results review for right achilles pain. Pt states achilles pain has not improved she even thinks it maybe getting worse.     HPI: 47 y.o. female presenting today to review her MRI of the right ankle/Achilles tendon.  She notes that her pain has continued since last seen.  She is also starting to get some discomfort to the left Achilles tendon now.  This is much milder than the right at this time.  Past Medical History:  Diagnosis Date   Acute on chronic respiratory failure with hypoxia (HCC)    COVID-19 virus infection    Pneumomediastinum (HCC)    Pneumonia due to COVID-19 virus     History reviewed. No pertinent surgical history.  Allergies  Allergen Reactions   Other Hives    strawberries   Raspberry Hives   Strawberry Flavor Hives     Physical Exam: General: The patient is alert and oriented x3 in no acute distress.  Dermatology:  No ecchymosis, erythema, or edema bilateral.  No open lesions.    Vascular: Palpable pedal pulses bilaterally. Capillary refill within normal limits.  No appreciable edema.    Neurological: Light touch sensation intact bilateral.  MMT 5/5 to lower extremity bilateral. Negative Tinel's sign with percussion of the posterior tibial nerve on the affected extremity.    Musculoskeletal Exam:  There is pain on palpation of the posterior aspect of the right heel.  There is palpable thickening of the Achilles tendon near the insertion into the calcaneus.  Antalgic gait noted with first steps out of exam chair.  No pain on palpation of the plantar heel.  No ecchymosis and minimal edema to posterior heel.  MRI Exam right ankle without contrast (07/24/2022):  CLINICAL DATA:  Achilles tendonitis. Posterior ankle pain for 2 years.   EXAM: MRI OF THE RIGHT ANKLE WITHOUT CONTRAST   TECHNIQUE: Multiplanar, multisequence MR imaging of the ankle was performed. No intravenous  contrast was administered.   COMPARISON:  None Available.   FINDINGS: TENDONS   Peroneal: Peroneal longus tendon intact. Peroneal brevis intact.   Posteromedial: Posterior tibial tendon intact. Flexor hallucis longus tendon intact. Flexor digitorum longus tendon intact.   Anterior: Tibialis anterior tendon intact. Extensor hallucis longus tendon intact Extensor digitorum longus tendon intact.   Achilles: There is marked thickening of the distal Achilles tendon with intrasubstance signal abnormality suggesting tendinosis with low-grade partial-thickness tear at the insertion. There is trace amount of fluid in the retrocalcaneal bursa.   Plantar Fascia: Intact.   LIGAMENTS   Lateral: Anterior talofibular ligament intact. Calcaneofibular ligament intact. Posterior talofibular ligament intact. Anterior and posterior tibiofibular ligaments intact.   Medial: Deltoid ligament intact. Spring ligament intact.   CARTILAGE   Ankle Joint: No joint effusion. Normal ankle mortise. No chondral defect.   Subtalar Joints/Sinus Tarsi: Normal subtalar joints. No subtalar joint effusion. Normal sinus tarsi.   Bones: Mild edema of the calcaneus about the insertion of the Achilles tendon. No fracture or dislocation.   Soft Tissue: No fluid collection or hematoma. Muscles are normal without edema or atrophy. Tarsal tunnel is normal.   IMPRESSION: 1. Marked thickening of the distal Achilles tendon with intrasubstance signal abnormality suggesting tendinosis with low-grade partial-thickness tear at the insertion. 2. Mild edema of the calcaneus about the insertion of the Achilles tendon, likely reactive changes. 3. No evidence of fracture or osteonecrosis. 4. Ligaments and  other tendons are intact.     Electronically Signed   By: Larose Hires D.O.   On: 08/04/2022 21:10     Assessment/Plan of Care: 1. Haglund's deformity of right heel   2. Calcaneal spur, right   3. Achilles  tendinitis of right lower extremity     The MRI images were reviewed with the patient today in the exam room.  She was shown where the Achilles tendon is thickened and the extensive amount of inflammation in the area.  She was also shown her spur on the Haglund's bump which is easily visible on MRI.  She was shown the retrocalcaneal bursitis as well.  Discussed need for surgical intervention to provide relief for this patient.  She was in agreement to proceed.  Explained in detail the procedure which will include a secondary repair of the damaged right Achilles tendon with possible use of allograft for tendon reinforcement, as well as resection of her Haglund's deformity.  Discussed need to be in a posterior splint for 1 to 2 weeks postoperatively with no weightbearing.  She prefers to use a knee scooter postoperatively.  Approximately 2 weeks postoperatively, may transfer to a Achilles walker boot for partial weightbearing for an additional 4 weeks.  And then possible flat cam walker for 2 more weeks while starting physical therapy and rehab.  Informed patient this would be performed at [facility] center as an outpatient basis under intravenous sedation with local anesthesia.  She will be prone for the procedure.  Discussed benefits risks and possible postoperative complications with the patient today.  Verbal consent was obtained to proceed with surgical planning.   She notes that she would like to proceed with surgery sometime in July.  She will need to follow-up for a preoperative consultation to include review and signing the consent form.  Clerance Lav, DPM, FACFAS [facility]     [address] [place], [place] [address]                Office [phone]  Fax [phone]

## 2022-08-13 ENCOUNTER — Telehealth: Payer: Self-pay | Admitting: Urology

## 2022-08-13 NOTE — Telephone Encounter (Signed)
DOS - 09/07/22  CALCANEAL OSTECTOMY RIGHT --- 45409 ACHILLES TENOLYSIS RIGHT --- [address]  [employer] EFFECTIVE DATE - 03/01/21   PER [employer]'S AUTOMATED SYSTEM FOR CPT CODES 47829 AND 56213 NO PRIOR AUTH IS REQUIRED.  CALL REF # [id number] CALL REF # [id number]

## 2022-08-19 ENCOUNTER — Other Ambulatory Visit: Payer: Self-pay | Admitting: Podiatry

## 2022-08-19 DIAGNOSIS — M9261 Juvenile osteochondrosis of tarsus, right ankle: Secondary | ICD-10-CM

## 2022-08-19 DIAGNOSIS — M7661 Achilles tendinitis, right leg: Secondary | ICD-10-CM

## 2022-08-20 ENCOUNTER — Telehealth: Payer: Self-pay | Admitting: Urology

## 2022-08-20 ENCOUNTER — Other Ambulatory Visit: Payer: Self-pay | Admitting: Podiatry

## 2022-08-20 DIAGNOSIS — M9261 Juvenile osteochondrosis of tarsus, right ankle: Secondary | ICD-10-CM

## 2022-08-20 DIAGNOSIS — M7661 Achilles tendinitis, right leg: Secondary | ICD-10-CM

## 2022-08-20 NOTE — Telephone Encounter (Signed)
CALLED PT TO ADVISE OF DR MCCAUGHAN'S INSTRUCTIONS ABOUT PREFERRING FOR HER TO BE OUT OF WORK 4 WEEKS POST SURGERY DUE TO RISK OF FALLING/INFECTION, RISK OF RE INJURY, AND TOLD HER THAT SHE AND DR MCCAUGHAN CAN DISCUSS THIS FURTHER AT HER FIRST POST OP VISIT. ADVISED THAT WE PLACED THE ORDER FOR HER KNEE SCOOTER WITH [facility] AND THEY WOULD BE IN CONTACT WITH HER.

## 2022-09-07 HISTORY — PX: OTHER SURGICAL HISTORY: SHX169

## 2022-09-15 ENCOUNTER — Encounter: Payer: Managed Care, Other (non HMO) | Admitting: Podiatry

## 2022-10-04 ENCOUNTER — Encounter: Payer: Self-pay | Admitting: Podiatry

## 2022-10-04 ENCOUNTER — Other Ambulatory Visit: Payer: Self-pay | Admitting: Podiatry

## 2022-10-04 DIAGNOSIS — M7661 Achilles tendinitis, right leg: Secondary | ICD-10-CM

## 2022-10-04 DIAGNOSIS — M9261 Juvenile osteochondrosis of tarsus, right ankle: Secondary | ICD-10-CM

## 2022-10-04 MED ORDER — AMOXICILLIN 500 MG PO CAPS: 500.0000 mg | ORAL_CAPSULE | Freq: Two times a day (BID) | ORAL | 0 refills | Status: AC

## 2022-10-04 MED ORDER — HYDROCODONE-ACETAMINOPHEN 10-325 MG PO TABS: 1.0000 | ORAL_TABLET | Freq: Four times a day (QID) | ORAL | 0 refills | Status: AC | PRN

## 2022-10-05 DIAGNOSIS — M7661 Achilles tendinitis, right leg: Secondary | ICD-10-CM | POA: Diagnosis not present

## 2022-10-05 DIAGNOSIS — M2021 Hallux rigidus, right foot: Secondary | ICD-10-CM | POA: Diagnosis not present

## 2022-10-14 ENCOUNTER — Encounter: Payer: Managed Care, Other (non HMO) | Admitting: Podiatry

## 2022-10-15 ENCOUNTER — Ambulatory Visit (INDEPENDENT_AMBULATORY_CARE_PROVIDER_SITE_OTHER): Payer: Managed Care, Other (non HMO)

## 2022-10-15 ENCOUNTER — Ambulatory Visit (INDEPENDENT_AMBULATORY_CARE_PROVIDER_SITE_OTHER): Payer: Managed Care, Other (non HMO) | Admitting: Podiatry

## 2022-10-15 ENCOUNTER — Encounter: Payer: Self-pay | Admitting: Podiatry

## 2022-10-15 DIAGNOSIS — R600 Localized edema: Secondary | ICD-10-CM

## 2022-10-15 DIAGNOSIS — Z09 Encounter for follow-up examination after completed treatment for conditions other than malignant neoplasm: Secondary | ICD-10-CM | POA: Diagnosis not present

## 2022-10-15 DIAGNOSIS — M7661 Achilles tendinitis, right leg: Secondary | ICD-10-CM | POA: Diagnosis not present

## 2022-10-15 DIAGNOSIS — M9261 Juvenile osteochondrosis of tarsus, right ankle: Secondary | ICD-10-CM

## 2022-10-15 NOTE — Progress Notes (Signed)
Chief Complaint  Patient presents with   Routine Post Op    POV # 1 DOS 09/07/22 --- SECONDARY REPAIR OF RIGHT ACHILLES TENDON WITH POSSIBLE USE OF ALLOGRAFT REINFORCMENT AND ANCHOR SYSTEM. HAGLUNDS RESECTIONRIGHT FOOT   HPI: 47 y.o. female presents today for postop visit #1 following right Haglund's resection with secondary repair of right Achilles tendon with Arthrex speed bridge and arthroflex dermal allograft.  Patient did have moderate pain in the first few days after surgery but states that she is not having any pain now.  She still has plenty of her hydrocodone medication remaining.  Her husband is with her today.  She denies fever, chills, night sweats, chest pain, shortness of breath, pain in calf.  She has been using her knee scooter and has remained nonweightbearing on the right.  The posterior splint is clean dry and intact.  Past Medical History:  Diagnosis Date   Acute on chronic respiratory failure with hypoxia (HCC)    COVID-19 virus infection    Pneumomediastinum (HCC)    Pneumonia due to COVID-19 virus    Past Surgical History:  Procedure Laterality Date   Haglund's resection Right 09/07/2022   with secondary repair of achilles tendon   Allergies  Allergen Reactions   Other Hives    strawberries   Raspberry Hives   Strawberry Flavor Hives    Physical Exam: There were no vitals filed for this visit.  On exam, there are palpable pedal pulses.  There is localized edema to the posterior aspect of the right heel and ankle.  The incision is well-approximated.  The sutures are intact.  No significant drainage is noted on her original dressing.  There is tenderness on palpation of the area.  There is some decreased sensation secondary to swelling around the incision area when the skin was lightly brushed with a Q-tip.  No evidence of dehiscence is noted.  No pain with compression of the calf.  No clinical signs of infection are noted  Radiographic Exam (right foot, 3  nonweightbearing views, 10/15/2022):  Normal osseous mineralization. Joint spaces preserved.  No fractures or osseous irregularities noted.  Assessment/Plan of Care: 1. Postoperative examination   2. Achilles tendinitis of right lower extremity   3. Haglund's deformity of right heel     Discussed clinical findings with patient and her husband today.  Reviewed the intraoperative radiographs with the patient today.  Overall she expressed that she is pleased with her outcome thus far.  The incision was then redressed with antibiotic ointment and a dry sterile dressing followed by an Ace wrap.  She was gently placed into a large, tall prefabricated pneumatic heel lift cam walker today.  This fit her well.  Patient was instructed to wear this at all times and not remove.  She is still to remain nonweightbearing and elevate the foot above the level of the heart is much as possible.  She will continue with her baby aspirin for another 2 weeks.  Follow-up in 2 weeks for dressing change, possible suture removal, and assess whether partial weightbearing can ensue at that time.   Clerance Lav, DPM, FACFAS [facility]     [address], [place] [address]  Office [phone]  Fax [phone]

## 2022-10-27 ENCOUNTER — Ambulatory Visit (INDEPENDENT_AMBULATORY_CARE_PROVIDER_SITE_OTHER): Payer: Managed Care, Other (non HMO) | Admitting: Podiatry

## 2022-10-27 DIAGNOSIS — M9261 Juvenile osteochondrosis of tarsus, right ankle: Secondary | ICD-10-CM

## 2022-10-27 DIAGNOSIS — M7661 Achilles tendinitis, right leg: Secondary | ICD-10-CM

## 2022-10-27 DIAGNOSIS — Z09 Encounter for follow-up examination after completed treatment for conditions other than malignant neoplasm: Secondary | ICD-10-CM

## 2022-10-27 NOTE — Progress Notes (Signed)
   HPI: 47 y.o. female presents today for p/o visit.  Notes no pain and hasn't been taking any pain medication recently.  She has been NWB on knee scooter with heel-lift camwalker in place.  Her husband is with her for today's visit.   Past Medical History:  Diagnosis Date   Acute on chronic respiratory failure with hypoxia (HCC)    COVID-19 virus infection    Pneumomediastinum (HCC)    Pneumonia due to COVID-19 virus     Past Surgical History:  Procedure Laterality Date   Haglund's resection Right 09/07/2022   with secondary repair of achilles tendon    Allergies  Allergen Reactions   Other Hives    strawberries   Raspberry Hives   Strawberry Flavor Hives     Physical Exam: Palpable pedal pulses noted.  Localized edema to posterior right heel.  No dehiscence noted, but there is minimal maceration noted to inferior aspect of incision.  No gapping.  No erythema.  No clinical signs of infection.  Tenderness to area upon palpation.    Assessment/Plan of Care: 1. Postoperative examination   2. Haglund's deformity of right heel   3. Achilles tendinitis of right lower extremity    Discussed clinical findings with patient today.  Sutures were removed today and steri-strips applied.  Light gauze dressing applied.  Continue with heel-lift camwalker.  She may perform "touch down" with the right foot for the next week, then may try minimal fwb the following week.  If painful, remain nwb with knee scooter.  Today's dressing may be removed tomorrow for bathing.  Replace prn for any sensitivity to posterior heel.    F/u 2 weeks.  Will reassess at that time to possibly remove 2 of the heel lifts in her camwalker and allow fwb with more activities.     Clerance Lav, DPM, FACFAS [facility]     [address] [place], [place] [address]                Office [phone]  Fax [phone]

## 2022-10-28 ENCOUNTER — Encounter: Payer: Managed Care, Other (non HMO) | Admitting: Podiatry

## 2022-11-03 ENCOUNTER — Ambulatory Visit (INDEPENDENT_AMBULATORY_CARE_PROVIDER_SITE_OTHER): Payer: Managed Care, Other (non HMO) | Admitting: Podiatry

## 2022-11-03 DIAGNOSIS — Z09 Encounter for follow-up examination after completed treatment for conditions other than malignant neoplasm: Secondary | ICD-10-CM

## 2022-11-03 DIAGNOSIS — L03119 Cellulitis of unspecified part of limb: Secondary | ICD-10-CM

## 2022-11-03 MED ORDER — DOXYCYCLINE HYCLATE 100 MG PO CAPS: 100.0000 mg | ORAL_CAPSULE | Freq: Two times a day (BID) | ORAL | 0 refills | Status: AC

## 2022-11-05 NOTE — Progress Notes (Signed)
      Chief Complaint  Patient presents with   Post-op Problem    POV #3 DOS 10/05/22 --- SECONDARY REPAIR OF RIGHT ACHILLES TENDON WITH POSSIBLE USE OF ALLOGRAFT REINFORCMENT AND ANCHOR SYSTEM. HAGLUNDS RESECTIONRIGHT FOOT   HPI: 47 y.o. female presents today after messaging via MyChart and sharing a picture of the incision, which appeared red and swollen, and she has started feeling pain in the lower posterior heel.  She is wearing her camwalker and performing minimal touch-down wb at this time.    Past Medical History:  Diagnosis Date   Acute on chronic respiratory failure with hypoxia (HCC)    COVID-19 virus infection    Pneumomediastinum (HCC)    Pneumonia due to COVID-19 virus     Past Surgical History:  Procedure Laterality Date   Haglund's resection Right 09/07/2022   with secondary repair of achilles tendon    Allergies  Allergen Reactions   Other Hives    strawberries   Raspberry Hives   Strawberry Flavor Hives   Physical Exam: Palpable pedal pulses.  Localized edema and erythema to posterior right heel.  Mild calor. Minimal dehiscence of inferior portion of incision.  No purulence.  Minimal macerated edges of incision.  Superior portion of incision coapting well.    Assessment/Plan of Care: 1. Cellulitis of foot   2. Postoperative examination     Meds ordered this encounter  Medications   doxycycline (VIBRAMYCIN) 100 MG capsule    Sig: Take 1 capsule (100 mg total) by mouth 2 (two) times daily for 10 days.    Dispense:  20 capsule    Refill:  0   Discussed clinical findings with patient today.  New steri-strips applied across incision.  Betadine and DSD applied.  She will change this daily and use betadine.  Continue with foot/leg elevation daily.  Rx doxycycline 100mg  BID x10 days.  Go back to NWB of surgical foot.  Use knee scooter.  F/u as scheduled next week.    Clerance Lav, DPM, FACFAS [facility]     [address] [place], [place] [address]                Office [phone]  Fax [phone]

## 2022-11-11 ENCOUNTER — Ambulatory Visit (INDEPENDENT_AMBULATORY_CARE_PROVIDER_SITE_OTHER): Payer: Managed Care, Other (non HMO)

## 2022-11-11 ENCOUNTER — Ambulatory Visit (INDEPENDENT_AMBULATORY_CARE_PROVIDER_SITE_OTHER): Payer: Managed Care, Other (non HMO) | Admitting: Podiatry

## 2022-11-11 DIAGNOSIS — Z09 Encounter for follow-up examination after completed treatment for conditions other than malignant neoplasm: Secondary | ICD-10-CM

## 2022-11-11 DIAGNOSIS — B070 Plantar wart: Secondary | ICD-10-CM

## 2022-11-11 DIAGNOSIS — B379 Candidiasis, unspecified: Secondary | ICD-10-CM

## 2022-11-11 MED ORDER — FLUCONAZOLE 150 MG PO TABS: 150.0000 mg | ORAL_TABLET | Freq: Once | ORAL | 0 refills | Status: AC

## 2022-11-14 NOTE — Progress Notes (Signed)
Chief Complaint  Patient presents with   Routine Post Op    POV # 3 DOS 10/05/22 --- SECONDARY REPAIR OF RIGHT ACHILLES TENDON WITH POSSIBLE USE OF ALLOGRAFT REINFORCMENT AND ANCHOR SYSTEM. HAGLUNDS RESECTIONRIGHT FOOT   HPI: 47 y.o. Susan [name] presents today for postop appointment today.  She had been corresponding via [employer] with photos including some drainage coming from the incision on the posterior aspect of the right heel.  She also notes some pain to the plantar aspect of the heel and feels like there is "something there".  She notes that the cam walker is uncomfortable.  She had been restricted to return to nonweightbearing when she send in the pictures with the increased swelling and drainage coming from the incision.  She denies fever, chills, nausea, vomiting at this time today.  Her husband is present today.  She notes that since taking the antibiotics she has had some issues involving the genitalia.  She describes a possible vaginal yeast infection today.  Past Medical History:  Diagnosis Date   Acute on chronic respiratory failure with hypoxia (HCC)    COVID-19 virus infection    Pneumomediastinum (HCC)    Pneumonia due to COVID-19 virus     Past Surgical History:  Procedure Laterality Date   Haglund's resection Right 09/07/2022   with secondary repair of achilles tendon    Allergies  Allergen Reactions   Other Hives    strawberries   Raspberry Hives   Strawberry Flavor Hives     Physical Exam: There are palpable pedal pulses on the right foot.  On evaluation of the posterior heel incision, the Steri-Strips are intact.  No active drainage is noted.  No necrosis is seen.  There is minimal dehiscence at the inferior aspect of the incision with a small amount of fibrous tissue present.  This is the only area that is tender on palpation as well.  There is improved edema since last seen.  There is a spongy hyperkeratotic lesion noted on the plantar aspect of  the right heel with black pinpoint dots within the lesion.  This is circular and measures approximately 4 mm in diameter.  This is consistent with plantar verruca.  Radiographic Exam (right foot, 3 nonweightbearing views, 11/11/2022):  Normal osseous mineralization.  Minimal calcification noted within the soft tissues of the tendo Achilles at the location of the ostectomy.  No radiographic signs of the Achilles speed bridge repair being prominent or failing.  No gas seen within the soft tissues  Assessment/Plan of Care: 1. Postoperative examination   2. Plantar wart, right foot   3. Antibiotic-induced yeast infection     Meds ordered this encounter  Medications   fluconazole (DIFLUCAN) 150 MG tablet    Sig: Take 1 tablet (150 mg total) by mouth once for 1 dose.    Dispense:  1 tablet    Refill:  0   Discussed clinical findings with patient today.  Swabs were obtained of the fibrous area of the inferior aspect of the incision for culture.  The Steri-Strips were replaced today.  Medihoney ointment was applied to the inferior aspect of the incision where the small amount of fibrous tissue was noted at the site of some dehiscence.  And the remainder was dressed with Betadine solution followed by a light gauze dressing.  The plantar verruca on the right heel was treated with Cantharone solution followed by a Band-Aid.  She can remove the Band-Aid in the next 5  to 7 hours.  She was in directed that the area may be tender in 24 to 48 hours from today and could possibly produce a small blister.  2 of the lifts in the Achilles heel walker were removed today.  2 lifts remain.  She was instructed she may begin to go full weightbearing at this time since the foot looks much better.  However she needs to keep her activities to a minimum due to the swelling and small area of dehiscence still present.  As she felt immediate improvement of her discomfort in the cam walker once the adjustments were made.  The  patient noted that since she has been taking the oral antibiotics, as she has had some symptoms of yeast infection.  Prescribed single dose of Diflucan 150 mg for her to take today.  Follow-up in 2 weeks   Susan Susan [name] DBurna Susan [name], DPM, FACFAS [facility]     [address] [place], [place] [address]                Office [phone]  Fax [phone]

## 2022-11-24 ENCOUNTER — Ambulatory Visit (INDEPENDENT_AMBULATORY_CARE_PROVIDER_SITE_OTHER): Payer: Managed Care, Other (non HMO) | Admitting: Podiatry

## 2022-11-24 DIAGNOSIS — M9261 Juvenile osteochondrosis of tarsus, right ankle: Secondary | ICD-10-CM

## 2022-11-24 DIAGNOSIS — R601 Generalized edema: Secondary | ICD-10-CM | POA: Diagnosis not present

## 2022-11-24 DIAGNOSIS — M7661 Achilles tendinitis, right leg: Secondary | ICD-10-CM | POA: Diagnosis not present

## 2022-11-24 DIAGNOSIS — Z09 Encounter for follow-up examination after completed treatment for conditions other than malignant neoplasm: Secondary | ICD-10-CM | POA: Diagnosis not present

## 2022-11-26 NOTE — Progress Notes (Unsigned)
Chief Complaint  Patient presents with   Routine Post Op    POV # 4 DOS 10/05/22 --- SECONDARY REPAIR OF RIGHT ACHILLES TENDON WITH POSSIBLE USE OF ALLOGRAFT REINFORCMENT AND ANCHOR SYSTEM. HAGLUNDS RESECTIONRIGHT FOOT   HPI: 47 y.o. female presents today with her husband for postop visit.  At her last visit, she had a small area of dehiscence on the inferior aspect of the incision on the posterior heel.  Steri-Strips have been applied and she was to continue with the cam boot.  2 of the lifts were removed at the last visit.  Today's evaluation is to see if the remaining 2 heel lifts can be removed today.  She notes that the boot is still rubbing and very uncomfortable.  They have tried to adjust the straps and it is showing significant signs of wear.  The Velcro is no longer closing as well.  The bladder also does not appear to be working.  This cam walker is no longer functioning as it should for her for her condition.  Past Medical History:  Diagnosis Date   Acute on chronic respiratory failure with hypoxia (HCC)    COVID-19 virus infection    Pneumomediastinum (HCC)    Pneumonia due to COVID-19 virus     Past Surgical History:  Procedure Laterality Date   Haglund's resection Right 09/07/2022   with secondary repair of achilles tendon    Allergies  Allergen Reactions   Other Hives    strawberries   Raspberry Hives   Strawberry Flavor Hives     Physical Exam: Palpable pulses right foot.  There remains generalized edema in the lower leg and ankle as well as the posterior heel area.  The small area of dehiscence on the inferior aspect of the incision on the posterior right heel has improved approximately 80% at this time.  A dry eschar has formed.  No active drainage is noted.  Steri-Strips were still in place.  No pain with dorsiflexion of the foot to 90 degrees to the leg.  Resisted plantarflexion strength is good.  Assessment/Plan of Care: 1. Postoperative examination    2. Haglund's deformity of right heel   3. Achilles tendinitis of right lower extremity     PR PNEUMAT WALKING BOOT PRE CST.  Since the patient's cam walker is no longer effective for her and is irreparable, she was fitted for a new pneumatic cam walker.  She was dispensed the short walker today instead of the high cam walker.  She noted immediate improvement in comfort and overall fit with the new cam walker.  Will have patient wear this at all times weightbearing.  She can remove for sleeping.  She can remove for bathing.  Continue with the Betadine ointment to the posterior aspect of the heel at the lower incision area.  The Steri-Strips were replaced today.  Follow-up in 1 week to assess need for physical therapy and return to work.  She will continue with minimal weightbearing while getting used to finally having the foot 90 degrees to the leg.  She does not need to use any assistive ambulatory aids at this time.  Clerance Lav, DPM, FACFAS [facility]     [address]  [place], [place] [address]                Office [phone]  Fax [phone]

## 2022-12-01 ENCOUNTER — Encounter: Payer: Self-pay | Admitting: Podiatry

## 2022-12-01 ENCOUNTER — Ambulatory Visit (INDEPENDENT_AMBULATORY_CARE_PROVIDER_SITE_OTHER): Payer: Managed Care, Other (non HMO)

## 2022-12-01 ENCOUNTER — Ambulatory Visit (INDEPENDENT_AMBULATORY_CARE_PROVIDER_SITE_OTHER): Payer: Managed Care, Other (non HMO) | Admitting: Podiatry

## 2022-12-01 DIAGNOSIS — M7661 Achilles tendinitis, right leg: Secondary | ICD-10-CM

## 2022-12-01 DIAGNOSIS — Z09 Encounter for follow-up examination after completed treatment for conditions other than malignant neoplasm: Secondary | ICD-10-CM

## 2022-12-04 NOTE — Progress Notes (Signed)
Chief Complaint  Patient presents with   Routine Post Op    POV # 4 DOS 10/05/22 --- SECONDARY REPAIR OF RIGHT ACHILLES TENDON WITH POSSIBLE USE OF ALLOGRAFT REINFORCMENT AND ANCHOR SYSTEM. HAGLUNDS RESECTIONRIGHT FOOT.     HPI: 47 y.o. female presents today for postop appointment after having the right Haglund's resection with partial reattachment of the Achilles tendon with allograft reinforcement.  She notes that she is having pain at the inferior aspect of the incision still.  She is full weightbearing at this time in the cam walker.  There are no lifts in the cam walker.  As she does feel pain and pulling when she is walking flat versus having that heel elevated.  She notes that she is starting to have some of the same symptoms in the posterior aspect of the left heel nail.  Denies injury.  She has been using the Medihoney ointment on the inferior aspect of the incision which had been granulating in nicely at her last appointment.  Past Medical History:  Diagnosis Date   Acute on chronic respiratory failure with hypoxia (HCC)    COVID-19 virus infection    Pneumomediastinum (HCC)    Pneumonia due to COVID-19 virus     Past Surgical History:  Procedure Laterality Date   Haglund's resection Right 09/07/2022   with secondary repair of achilles tendon    Allergies  Allergen Reactions   Other Hives    strawberries   Raspberry Hives   Strawberry Flavor Hives    Physical Exam: Palpable pedal pulses right foot.  Localized edema to the posterior aspect of the right ankle and heel.  The incision is coapted well with exception of approximately 1.5 cm of the most inferior aspect of the incision.  There is eschar forming and this is smaller than on last visit.  No clinical signs of infection are noted to the area.  Ankle dorsiflexion to 90 degrees and beyond produces some pain in the Achilles insertion area.  Radiographic Exam right foot, 3 weightbearing views, 10-24):  Normal  osseous mineralization.  Achilles tendon when viewing the shadows, appears intact at this time.  Some small calcifications are noted within the distal Achilles tendon.  No fracture or periosteal reaction noted  Assessment/Plan of Care: 1. Postoperative examination   2. Achilles tendinitis of right lower extremity     AMB REFERRAL TO PHYSICAL THERAPY  Discussed clinical findings with patient today.  She may slowly begin weaning back into regular shoe gear.  Any shoe with a slight heel elevation would be more comfortable for her at this time.  Also due to the swelling in the posterior aspect of the ankle and heel, she may benefit from an open back style shoe.  Discussed referral to physical therapy to help rehab the right heel but also prevent worsening on the left heel.  This referral was placed today.  She will continue with the Medihoney and light gauze dressing to the posterior right heel.  At the end of today's appointment Medihoney and light gauze covering was applied to the lower incision area of the right heel.  Follow-up in 4 to 6 weeks to recheck her recovery after physical therapy   [name] DBurna [name], DPM, FACFAS [facility]     [address]  [place], [place] [address]                Office [phone]  Fax [phone]

## 2022-12-09 ENCOUNTER — Other Ambulatory Visit: Payer: Self-pay | Admitting: Podiatry

## 2022-12-09 MED ORDER — MELOXICAM 7.5 MG PO TABS: 7.5000 mg | ORAL_TABLET | Freq: Every day | ORAL | 0 refills | Status: AC

## 2023-01-12 ENCOUNTER — Ambulatory Visit (INDEPENDENT_AMBULATORY_CARE_PROVIDER_SITE_OTHER): Payer: Managed Care, Other (non HMO) | Admitting: Podiatry

## 2023-01-12 ENCOUNTER — Encounter: Payer: Self-pay | Admitting: Podiatry

## 2023-01-12 DIAGNOSIS — Z09 Encounter for follow-up examination after completed treatment for conditions other than malignant neoplasm: Secondary | ICD-10-CM

## 2023-01-12 DIAGNOSIS — R601 Generalized edema: Secondary | ICD-10-CM | POA: Diagnosis not present

## 2023-01-12 DIAGNOSIS — M216X1 Other acquired deformities of right foot: Secondary | ICD-10-CM | POA: Diagnosis not present

## 2023-01-13 ENCOUNTER — Encounter: Payer: Self-pay | Admitting: Podiatry

## 2023-01-14 NOTE — Progress Notes (Signed)
      Chief Complaint  Patient presents with   Routine Post Op    Right haglund's and retro calc exostectomy 8/6. Has started PT   HPI: 47 y.o. female presents today for her 4 month post-op appointment.  She just started physical therapy and notes continued pain in lower posterior heel.  Has had 3 visits so far and states no massage is being performed (soft tissue manipulation), no ultrasound or other modalities are being used, and no icing occurs.  She walks on treadmill, rides a bike, and performs some stretching exercises on her own at P.T.  States the therapist is not watching her most of the time.  She expressed frustration.  She is wearing regular shoes at this time and is wearing compression knee-hi's.  Past Medical History:  Diagnosis Date   Acute on chronic respiratory failure with hypoxia (HCC)    COVID-19 virus infection    Pneumomediastinum (HCC)    Pneumonia due to COVID-19 virus    Past Surgical History:  Procedure Laterality Date   Haglund's resection Right 09/07/2022   with secondary repair of achilles tendon   Allergies  Allergen Reactions   Other Hives    strawberries   Raspberry Hives   Strawberry Flavor Hives    Physical Exam: Palpable pedal pulses right foot.  +1 pitting edema right leg and ankle.  Incision coapted with no clinical signs of infection.  No calor or rubor.  Achilles tendon palpated and intact.  Pain on palpation most inferior portion of incision/scar.  Ankle df less than 7 degrees with knee extended.    Assessment/Plan of Care: 1. Generalized edema   2. Postoperative examination   3. Acquired equinus deformity of right foot    Discussed clinical findings with patient today.  We tried to call her physical therapist today to discuss patient's care.  Could not get through to anyone.  A new Rx for P.T. was printed and patient instructed she can try P.T. one more time if we are able to speak with a therapist today/tomorrow, but if her therapy does  not improve and modalities and STM not performed, then she can start P.T. at a new facility.  Her current therapy, as she described today, will not help her if it continues in this manner.    F/u 4 weeks to recheck after P.T.   Clerance Lav, DPM, FACFAS [facility]     [address] [place], [place] [address]                Office [phone]  Fax [phone]

## 2023-02-09 ENCOUNTER — Ambulatory Visit (INDEPENDENT_AMBULATORY_CARE_PROVIDER_SITE_OTHER): Payer: Managed Care, Other (non HMO) | Admitting: Podiatry

## 2023-02-09 DIAGNOSIS — M792 Neuralgia and neuritis, unspecified: Secondary | ICD-10-CM | POA: Diagnosis not present

## 2023-02-09 DIAGNOSIS — R601 Generalized edema: Secondary | ICD-10-CM

## 2023-02-13 NOTE — Progress Notes (Signed)
Chief Complaint  Patient presents with   Routine Post Op    Doing well, no concerns, doesn't think PT helped   HPI: 47 y.o. female presents today after undergoing a right Haglund's resection with secondary repair of Achilles tendon on 09/07/2022.  She has completed physical therapy but did not feel that the physical therapist from [facility] was very helpful.  She did most of the PT herself at home based on recommendations at her last visit here.  States that she has been massaging the area daily.  She still has some pain towards the inferior aspect of the incision/surgical area.  She has returned to normal shoe gear.  Patient notes that the pain is now some burning localized to the posterior heel area near the lower portion of the incision.  Past Medical History:  Diagnosis Date   Acute on chronic respiratory failure with hypoxia (HCC)    COVID-19 virus infection    Pneumomediastinum (HCC)    Pneumonia due to COVID-19 virus     Past Surgical History:  Procedure Laterality Date   Haglund's resection Right 09/07/2022   with secondary repair of achilles tendon    Allergies  Allergen Reactions   Other Hives    strawberries   Raspberry Hives   Strawberry Flavoring Agent (Non-Screening) Hives    Physical Exam: There are palpable pedal pulses on the right foot.  There is significant improvement of edema and scar tissue/adhesions to the posterior aspect of the right heel.  No hypertrophy or keloid formation is noted in the scar area.  The Achilles tendon was palpated and noted to be intact with no palpable gaps or nodules noted.  Muscle strength is good with plantarflexion.  Ankle dorsiflexion has improved with stretching.  There is some pain at the most inferior aspect of the incision area.  Assessment/Plan of Care: 1. Neuralgia and neuritis   2. Generalized edema   3.      Post-op x 5 months.  Discussed clinical findings with patient today.  Will try to encourage patient that  she has made significant improvements since last visit.  Appreciated that the patient took matters into her own hands and performed her own physical therapy at home is much as possible.  This has resulted in improvement in range of motion, with decreased edema, with decreased adhesions, and with strength.  She noted that she does have a TENS unit at home that she can use on the area and she was shown the locations to place the TENS unit in case she does have any sensory nerve irritation from the postop edema and scar tissue.  She can perform TENS stimulation to the area once or twice daily for the next 30 days.  Recommend she continue with massage and stretching exercises as well.  She may use Voltaren gel when massaging the posterior heel up to twice daily.  She was encouraged to take time to remove this in well so that it can penetrate through the skin and soft tissue.  Patient informed it could take up to year for the scar tissue to completely resolve, and edema to improve.  She was encouraged to keep working on the area at home as this seemed to be most beneficial for her  Follow-up as needed   Clerance Lav, DPM, FACFAS [facility]     [address]  [place], [place] [address]                Office [phone]  Fax [phone]

## 2023-06-01 ENCOUNTER — Ambulatory Visit (INDEPENDENT_AMBULATORY_CARE_PROVIDER_SITE_OTHER): Admitting: Podiatry

## 2023-06-01 ENCOUNTER — Ambulatory Visit (INDEPENDENT_AMBULATORY_CARE_PROVIDER_SITE_OTHER)

## 2023-06-01 DIAGNOSIS — M7661 Achilles tendinitis, right leg: Secondary | ICD-10-CM

## 2023-06-01 DIAGNOSIS — R609 Edema, unspecified: Secondary | ICD-10-CM

## 2023-06-01 MED ORDER — PREDNISONE 10 MG PO TABS: ORAL_TABLET | ORAL | 0 refills | Status: AC

## 2023-06-01 NOTE — Progress Notes (Signed)
 HPI: 48 y.o. female presents today with concern of pain and swelling to the right ankle/heel area.  She previously underwent a Haglund's resection with secondary repair of her Achilles tendon utilizing Arthrex speed brace.  Her recovery was slowed by inability to find adequate physical therapy to aid in her recovery.  The patient actually had excellent improvement when performing her own therapy to the area at home.  She had been discharged and doing well.  Notes that she recently started working out again and feels that the pain flared up again.  She is worried that the area could be injured.  She has been limping secondary to pain.  Past Medical History:  Diagnosis Date   Acute on chronic respiratory failure with hypoxia (HCC)    COVID-19 virus infection    Pneumomediastinum (HCC)    Pneumonia due to COVID-19 virus     Past Surgical History:  Procedure Laterality Date   Haglund's resection Right 09/07/2022   with secondary repair of achilles tendon    Allergies  Allergen Reactions   Other Hives    strawberries   Raspberry Hives   Strawberry Flavoring Agent (Non-Screening) Hives    Physical Exam: There are palpable pedal pulses.  There is generalized edema to the posterior aspect of the right ankle/heel.  No gaps or nodules are noted within the Achilles tendon.  Ankle dorsiflexion is within normal limits at this time.  There is pain on palpation to the medial and lateral aspects of the Achilles at its insertion into the calcaneus.  No proximal Achilles pain is noted.  Manual muscle testing 5/5 of the gastrosoleus complex.  Epicritic sensation intact  Radiographic Exam:  Normal osseous mineralization.  There is thickening of the distal Achilles tendon noted without any gaps or nodule seen, but this is also noted on prior postop x-rays.  There are some calcifications noted within the Achilles tendon distally but this is unchanged since previous x-ray.  Assessment/Plan of Care: 1.  Achilles tendinitis, right leg   2. Swelling      Meds ordered this encounter  Medications   predniSONE (DELTASONE) 10 MG tablet    Sig: Take 6 tablets (60 mg total) by mouth daily with breakfast for 1 day, THEN 5 tablets (50 mg total) daily with breakfast for 1 day, THEN 4 tablets (40 mg total) daily with breakfast for 1 day, THEN 3 tablets (30 mg total) daily with breakfast for 1 day, THEN 2 tablets (20 mg total) daily with breakfast for 1 day, THEN 1 tablet (10 mg total) daily with breakfast for 1 day, THEN 1 tablet (10 mg total) daily with breakfast for 1 day.    Dispense:  22 tablet    Refill:  0   Discussed clinical findings with patient today.  The recent increase in activity with going to the gym may have caused a flareup of the Achilles tendon pain and swelling.  Recommend the patient continue with compression socks or elastic compressive ankle sleeve.  Continue with stretching exercises and massage to the area.  Informed the patient that her ankle dorsiflexion is normal which is a very good sign.  Due to the pain, she can return to her cam walker as needed but is not required.  Will send in tapered prednisone course to help resolve swelling and pain to the area.  Do not advise a cortisone injection since this area underwent prior surgery.    She can utilize heel lifts as needed  to create some slack in the tendon when working out.  Follow-up in 4 weeks for recheck.  If there is still significant pain and swelling to the area, may order an MRI to reevaluate the surgical site.  Clerance Lav, DPM, FACFAS [facility]     [address] [place], [place] [address]                Office [phone]  Fax [phone]

## 2023-06-08 ENCOUNTER — Encounter: Payer: Self-pay | Admitting: Podiatry

## 2023-06-08 ENCOUNTER — Other Ambulatory Visit: Payer: Self-pay | Admitting: Podiatry

## 2023-06-08 DIAGNOSIS — M792 Neuralgia and neuritis, unspecified: Secondary | ICD-10-CM

## 2023-06-08 DIAGNOSIS — R609 Edema, unspecified: Secondary | ICD-10-CM

## 2023-06-08 DIAGNOSIS — M7661 Achilles tendinitis, right leg: Secondary | ICD-10-CM
